# Patient Record
Sex: Male | Born: 1950 | Race: Black or African American | Hispanic: No | Marital: Married | Smoking: Never smoker
Health system: Southern US, Community
[De-identification: ages and names within clinical notes are randomized; demographics above are authoritative.]

## PROBLEM LIST (undated history)

## (undated) DIAGNOSIS — Z923 Personal history of irradiation: Secondary | ICD-10-CM

## (undated) DIAGNOSIS — K219 Gastro-esophageal reflux disease without esophagitis: Secondary | ICD-10-CM

## (undated) DIAGNOSIS — C801 Malignant (primary) neoplasm, unspecified: Secondary | ICD-10-CM

## (undated) DIAGNOSIS — R7303 Prediabetes: Secondary | ICD-10-CM

## (undated) DIAGNOSIS — I10 Essential (primary) hypertension: Secondary | ICD-10-CM

## (undated) DIAGNOSIS — Z9289 Personal history of other medical treatment: Secondary | ICD-10-CM

## (undated) DIAGNOSIS — R490 Dysphonia: Secondary | ICD-10-CM

---

## 2010-01-29 HISTORY — PX: COLONOSCOPY W/ POLYPECTOMY: SHX1380

## 2010-02-07 ENCOUNTER — Ambulatory Visit (HOSPITAL_COMMUNITY): Admission: RE | Admit: 2010-02-07 | Discharge: 2010-02-07 | Payer: Self-pay | Admitting: Gastroenterology

## 2012-01-14 ENCOUNTER — Encounter (HOSPITAL_COMMUNITY): Payer: Self-pay | Admitting: Pharmacy Technician

## 2012-01-15 ENCOUNTER — Encounter (HOSPITAL_COMMUNITY): Payer: Self-pay

## 2012-01-15 ENCOUNTER — Encounter (HOSPITAL_COMMUNITY)
Admission: RE | Admit: 2012-01-15 | Discharge: 2012-01-15 | Disposition: A | Discharge status: Home / Self Care | Payer: 59 | Source: Ambulatory Visit | Attending: Otolaryngology | Admitting: Otolaryngology

## 2012-01-15 ENCOUNTER — Other Ambulatory Visit: Payer: Self-pay | Admitting: Otolaryngology

## 2012-01-15 ENCOUNTER — Ambulatory Visit (HOSPITAL_COMMUNITY)
Admission: RE | Admit: 2012-01-15 | Discharge: 2012-01-15 | Disposition: A | Discharge status: Home / Self Care | Payer: 59 | Source: Ambulatory Visit | Attending: Otolaryngology | Admitting: Otolaryngology

## 2012-01-15 DIAGNOSIS — Z01810 Encounter for preprocedural cardiovascular examination: Secondary | ICD-10-CM | POA: Insufficient documentation

## 2012-01-15 DIAGNOSIS — Z01812 Encounter for preprocedural laboratory examination: Secondary | ICD-10-CM | POA: Insufficient documentation

## 2012-01-15 DIAGNOSIS — I10 Essential (primary) hypertension: Secondary | ICD-10-CM | POA: Insufficient documentation

## 2012-01-15 DIAGNOSIS — Z01818 Encounter for other preprocedural examination: Secondary | ICD-10-CM | POA: Insufficient documentation

## 2012-01-15 HISTORY — DX: Gastro-esophageal reflux disease without esophagitis: K21.9

## 2012-01-15 HISTORY — DX: Essential (primary) hypertension: I10

## 2012-01-15 LAB — BASIC METABOLIC PANEL
BUN: 14 mg/dL (ref 6–23)
CO2: 29 mEq/L (ref 19–32)
Calcium: 9.4 mg/dL (ref 8.4–10.5)
Chloride: 100 mEq/L (ref 96–112)
Creatinine, Ser: 0.96 mg/dL (ref 0.50–1.35)
GFR calc Af Amer: >90 mL/min (ref >90)

## 2012-01-15 LAB — SURGICAL PCR SCREEN
MRSA, PCR: NEGATIVE
Staphylococcus aureus: POSITIVE — AB

## 2012-01-15 LAB — CBC
HCT: 38.7 % — ABNORMAL LOW (ref 39.0–52.0)
MCHC: 33.9 g/dL (ref 30.0–36.0)
MCV: 83.6 fL (ref 78.0–100.0)
Platelets: 192 10*3/uL (ref 150–400)
RDW: 13.1 % (ref 11.5–15.5)
WBC: 6.4 10*3/uL (ref 4.0–10.5)

## 2012-01-15 NOTE — Pre-Procedure Instructions (Signed)
20 KAMON FAHR  01/15/2012   Your procedure is scheduled on:  Monday January 19, 2012 at 0730 AM  Report to [facility] at [phone].  Call this number if you have problems the morning of surgery: [phone]   Remember:   Do not eat food or drink:After Midnight.Sunday      Take these medicines the morning of surgery with A SIP OF WATER: Amlodipine Ismenia.Clines ] and  Pantoprazole [ Protonix ]   Do not wear jewelry,  Do not wear lotions,. You may wear deodorant.             Men may shave face and neck.  Do not bring valuables to the hospital.  Contacts, dentures or bridgework may not be worn into surgery.  Leave suitcase in the car. After surgery it may be brought to your room.  For patients admitted to the hospital, checkout time is 11:00 AM the day of discharge.   Patients discharged the day of surgery will not be allowed to drive home.    Special Instructions: Shower using CHG 2 nights before surgery and the night before surgery.  If you shower the day of surgery use CHG.  Use special wash - you have one bottle of CHG for all showers.  You should use approximately 1/3 of the bottle for each shower.   Please read over the following fact sheets that you were given: Pain Booklet, Coughing and Deep Breathing, MRSA Information and Surgical Site Infection Prevention

## 2012-01-16 ENCOUNTER — Other Ambulatory Visit (HOSPITAL_COMMUNITY): Payer: Self-pay

## 2012-01-16 NOTE — Progress Notes (Signed)
SPOKE WITH MANDY, DR [facility] NURSE RE: ORDERS NEEDING SIGNED.  MANDY WILL HAVE DR. [name] SIGN ORDERS.

## 2012-01-19 ENCOUNTER — Encounter (HOSPITAL_COMMUNITY)
Admission: RE | Disposition: A | Discharge status: Home / Self Care | Payer: Self-pay | Source: Ambulatory Visit | Attending: Otolaryngology

## 2012-01-19 ENCOUNTER — Encounter (HOSPITAL_COMMUNITY): Payer: Self-pay | Admitting: Anesthesiology

## 2012-01-19 ENCOUNTER — Ambulatory Visit (HOSPITAL_COMMUNITY): Payer: 59 | Admitting: Anesthesiology

## 2012-01-19 ENCOUNTER — Encounter (HOSPITAL_COMMUNITY): Payer: Self-pay | Admitting: *Deleted

## 2012-01-19 ENCOUNTER — Ambulatory Visit (HOSPITAL_COMMUNITY)
Admission: RE | Admit: 2012-01-19 | Discharge: 2012-01-19 | Disposition: A | Discharge status: Home / Self Care | Payer: 59 | Source: Ambulatory Visit | Attending: Otolaryngology | Admitting: Otolaryngology

## 2012-01-19 DIAGNOSIS — C320 Malignant neoplasm of glottis: Secondary | ICD-10-CM | POA: Insufficient documentation

## 2012-01-19 DIAGNOSIS — J383 Other diseases of vocal cords: Secondary | ICD-10-CM | POA: Diagnosis present

## 2012-01-19 DIAGNOSIS — C801 Malignant (primary) neoplasm, unspecified: Secondary | ICD-10-CM

## 2012-01-19 DIAGNOSIS — I10 Essential (primary) hypertension: Secondary | ICD-10-CM | POA: Insufficient documentation

## 2012-01-19 DIAGNOSIS — R498 Other voice and resonance disorders: Secondary | ICD-10-CM | POA: Insufficient documentation

## 2012-01-19 DIAGNOSIS — R490 Dysphonia: Secondary | ICD-10-CM | POA: Diagnosis present

## 2012-01-19 HISTORY — PX: MICROLARYNGOSCOPY: SHX5208

## 2012-01-19 HISTORY — PX: OTHER SURGICAL HISTORY: SHX169

## 2012-01-19 HISTORY — DX: Malignant (primary) neoplasm, unspecified: C80.1

## 2012-01-19 SURGERY — MICROLARYNGOSCOPY
Anesthesia: General | Site: Esophagus | Laterality: Left | Wound class: Contaminated

## 2012-01-19 MED ORDER — PROPOFOL 10 MG/ML IV BOLUS
INTRAVENOUS | Status: DC | PRN
  Administered 2012-01-19: 200 mg via INTRAVENOUS

## 2012-01-19 MED ORDER — METOCLOPRAMIDE HCL 5 MG/ML IJ SOLN: 10.0000 mg | Freq: Once | INTRAMUSCULAR | Status: DC | PRN

## 2012-01-19 MED ORDER — LACTATED RINGERS IV SOLN
INTRAVENOUS | Status: DC | PRN
  Administered 2012-01-19: 07:00:00 via INTRAVENOUS

## 2012-01-19 MED ORDER — OXYCODONE HCL 5 MG/5ML PO SOLN: 5.0000 mg | Freq: Once | ORAL | Status: DC | PRN

## 2012-01-19 MED ORDER — EPINEPHRINE HCL (NASAL) 0.1 % NA SOLN
NASAL | Status: AC
  Filled 2012-01-19: qty 30

## 2012-01-19 MED ORDER — ARTIFICIAL TEARS OP OINT
TOPICAL_OINTMENT | OPHTHALMIC | Status: DC | PRN
  Administered 2012-01-19: 1 via OPHTHALMIC

## 2012-01-19 MED ORDER — OXYCODONE HCL 5 MG PO TABS: 5.0000 mg | ORAL_TABLET | Freq: Once | ORAL | Status: DC | PRN

## 2012-01-19 MED ORDER — MINERAL OIL LIGHT 100 % EX OIL
TOPICAL_OIL | CUTANEOUS | Status: DC | PRN
  Administered 2012-01-19: 1 via TOPICAL

## 2012-01-19 MED ORDER — MINERAL OIL LIGHT 100 % EX OIL
TOPICAL_OIL | CUTANEOUS | Status: AC
  Filled 2012-01-19: qty 25

## 2012-01-19 MED ORDER — MIDAZOLAM HCL 5 MG/5ML IJ SOLN
INTRAMUSCULAR | Status: DC | PRN
  Administered 2012-01-19: 2 mg via INTRAVENOUS

## 2012-01-19 MED ORDER — EPINEPHRINE HCL (NASAL) 0.1 % NA SOLN
NASAL | Status: DC | PRN
  Administered 2012-01-19: 1 [drp] via NASAL

## 2012-01-19 MED ORDER — ROCURONIUM BROMIDE 100 MG/10ML IV SOLN
INTRAVENOUS | Status: DC | PRN
  Administered 2012-01-19: 35 mg via INTRAVENOUS

## 2012-01-19 MED ORDER — ONDANSETRON HCL 4 MG/2ML IJ SOLN
INTRAMUSCULAR | Status: DC | PRN
  Administered 2012-01-19: 4 mg via INTRAVENOUS

## 2012-01-19 MED ORDER — NEOSTIGMINE METHYLSULFATE 1 MG/ML IJ SOLN
INTRAMUSCULAR | Status: DC | PRN
  Administered 2012-01-19: 3 mg via INTRAVENOUS

## 2012-01-19 MED ORDER — LIDOCAINE HCL (CARDIAC) 20 MG/ML IV SOLN
INTRAVENOUS | Status: DC | PRN
  Administered 2012-01-19: 100 mg via INTRAVENOUS

## 2012-01-19 MED ORDER — 0.9 % SODIUM CHLORIDE (POUR BTL) OPTIME
TOPICAL | Status: DC | PRN
  Administered 2012-01-19: 1000 mL

## 2012-01-19 MED ORDER — HYDROMORPHONE HCL PF 1 MG/ML IJ SOLN: 0.2500 mg | INTRAMUSCULAR | Status: DC | PRN

## 2012-01-19 MED ORDER — FENTANYL CITRATE 0.05 MG/ML IJ SOLN
INTRAMUSCULAR | Status: DC | PRN
  Administered 2012-01-19 (×3): 50 ug via INTRAVENOUS

## 2012-01-19 MED ORDER — GLYCOPYRROLATE 0.2 MG/ML IJ SOLN
INTRAMUSCULAR | Status: DC | PRN
  Administered 2012-01-19: .4 mg via INTRAVENOUS

## 2012-01-19 SURGICAL SUPPLY — 26 items
CANISTER SUCTION 2500CC (MISCELLANEOUS) ×2 IMPLANT
CLOTH BEACON ORANGE TIMEOUT ST (SAFETY) ×2 IMPLANT
CONT SPEC 4OZ CLIKSEAL STRL BL (MISCELLANEOUS) ×2 IMPLANT
COVER TABLE BACK 60X90 (DRAPES) ×2 IMPLANT
DRAPE PROXIMA HALF (DRAPES) ×2 IMPLANT
DRESSING TELFA 8X3 (GAUZE/BANDAGES/DRESSINGS) ×4 IMPLANT
GLOVE BIOGEL M 7.0 STRL (GLOVE) ×2 IMPLANT
GUARD TEETH (MISCELLANEOUS) ×2 IMPLANT
HOLDER TRACH TUBE VELCRO 19.5 (MISCELLANEOUS) ×2 IMPLANT
KIT ROOM TURNOVER OR (KITS) ×2 IMPLANT
MARKER SKIN DUAL TIP RULER LAB (MISCELLANEOUS) IMPLANT
NEEDLE 18GX1X1/2 (RX/OR ONLY) (NEEDLE) ×2 IMPLANT
NS IRRIG 1000ML POUR BTL (IV SOLUTION) ×2 IMPLANT
PAD ARMBOARD 7.5X6 YLW CONV (MISCELLANEOUS) ×4 IMPLANT
PATTIES SURGICAL .5 X1 (DISPOSABLE) ×2 IMPLANT
SOLUTION ANTI FOG 6CC (MISCELLANEOUS) ×2 IMPLANT
SPONGE GAUZE 4X4 12PLY (GAUZE/BANDAGES/DRESSINGS) ×2 IMPLANT
TOWEL OR 17X24 6PK STRL BLUE (TOWEL DISPOSABLE) ×2 IMPLANT
TUBE CONNECTING 12X1/4 (SUCTIONS) ×2 IMPLANT
TUBE TRACH SHILEY  6 DIST  CUF (TUBING) IMPLANT
TUBE TRACH SHILEY 4 DIST CUF (TUBING) IMPLANT
TUBE TRACH SHILEY 6 76FEN UNCF (TUBING) IMPLANT
TUBE TRACH SHILEY 6 FEN UNCF (TUBING)
TUBE TRACH SHILEY 8 DIST CUF (TUBING) IMPLANT
TUBE TRACH SHILEY 8 FEN (TUBING) IMPLANT
WATER STERILE IRR 1000ML POUR (IV SOLUTION) ×2 IMPLANT

## 2012-01-19 NOTE — Brief Op Note (Signed)
01/19/2012  8:48 AM  PATIENT:  Jack Robinson  61 y.o. male  PRE-OPERATIVE DIAGNOSIS:  VOCAL CORD POLYP RIGHT SIDE  POST-OPERATIVE DIAGNOSIS:  VOCAL CORD POLYP RIGHT SIDE  PROCEDURE:  Procedure(s) (LRB) with comments: MICROLARYNGOSCOPY (Left) - MICROLARYNGOSCOPY WITH EXCISON OF VOCAL CORD POLYP  SURGEON:  Surgeon(s) and Role:    * Osborn Coho, MD - Primary  PHYSICIAN ASSISTANT:   ASSISTANTS: none   ANESTHESIA:   general  EBL:  25 cc  BLOOD ADMINISTERED:none  DRAINS: none   LOCAL MEDICATIONS USED:  NONE  SPECIMEN:  Source of Specimen:  Right Vocal Cord mass  DISPOSITION OF SPECIMEN:  PATHOLOGY  COUNTS:  YES  TOURNIQUET:  * No tourniquets in log *  DICTATION: .Other Dictation: Dictation Number [id number]  PLAN OF CARE: Discharge to home after PACU  PATIENT DISPOSITION:  PACU - hemodynamically stable.   Delay start of Pharmacological VTE agent (>24hrs) due to surgical blood loss or risk of bleeding: not applicable

## 2012-01-19 NOTE — Anesthesia Preprocedure Evaluation (Addendum)
Anesthesia Evaluation  Patient identified by MRN, date of birth, ID band Patient awake    Reviewed: Allergy & Precautions, H&P , NPO status , Patient's Chart, lab work & pertinent test results  History of Anesthesia Complications (+) DIFFICULT AIRWAY  Airway Mallampati: II TM Distance: >3 FB     Dental  (+) Teeth Intact   Pulmonary    Pulmonary exam normal       Cardiovascular hypertension, Pt. on medications     Neuro/Psych    GI/Hepatic GERD-  Medicated,  Endo/Other    Renal/GU      Musculoskeletal   Abdominal Normal abdominal exam  (+)   Peds  Hematology   Anesthesia Other Findings See surgeon's H&P   Reproductive/Obstetrics                          Anesthesia Physical Anesthesia Plan  ASA: II  Anesthesia Plan: General   Post-op Pain Management:    Induction: Intravenous  Airway Management Planned: Oral ETT  Additional Equipment:   Intra-op Plan:   Post-operative Plan: Extubation in OR  Informed Consent: I have reviewed the patients History and Physical, chart, labs and discussed the procedure including the risks, benefits and alternatives for the proposed anesthesia with the patient or authorized representative who has indicated his/her understanding and acceptance.   Dental advisory given  Plan Discussed with: CRNA, Anesthesiologist and Surgeon  Anesthesia Plan Comments:         Anesthesia Quick Evaluation

## 2012-01-19 NOTE — Op Note (Signed)
Jack Robinson, Jack Robinson               ACCOUNT NO.:  [id number]  MEDICAL RECORD NO.:  [id number]  LOCATION:  [facility]                         FACILITY:  [facility]  PHYSICIAN:  [name]. [name], M.D.DATE OF BIRTH:  1950-12-05  DATE OF PROCEDURE:  01/19/2012 DATE OF DISCHARGE:                              OPERATIVE REPORT   PREOPERATIVE DIAGNOSES: 1. Right vocal cord mass. 2. Chronic hoarseness.  POSTOPERATIVE DIAGNOSES: 1. Right vocal cord mass. 2. Chronic hoarseness.  INDICATION FOR SURGERY: 1. Right vocal cord mass. 2. Chronic hoarseness.  SURGICAL PROCEDURE:  Microlaryngoscopy with excision of right vocal cord mass.  ANESTHESIA:  General endotracheal.  SURGEON:  [name]. [name], MD  COMPLICATIONS:  None.  BLOOD LOSS:  Less than 25 mL.  There were no complications.  The patient transferred from the operating room to the recovery room in stable condition.  BRIEF HISTORY:  The patient is a 61 year old black male, referred to our office for evaluation of chronic hoarseness, reports a 6 month low-grade history and chronic hoarseness.  The patient is a nonsmoker.  He has a medical history of gastroesophageal reflux, hypertension.  He has been treated with reflux therapy for presumed reflux induced hoarseness.  He denied significant problems with swallowing, dysphagia or aspiration and had no respiratory concerns.  He is referred to our office for outpatient evaluation and flexible laryngoscopy in the office showed a pedunculated mass involving the right side of the larynx.  No ulceration, bleeding, or other finding with the exception of post glottic erythema consistent with active reflux.  Given his history and physical examination, I recommended microlaryngoscopy with excisional biopsy of the mass.  The risks and benefits of procedure were discussed with the patient, who understood and concurred with our plan for surgery which is scheduled on elective basis at [facility] OR.  PROCEDURE:  The patient was brought to the operating room on January 19, 2012 and placed in supine position on the operating table.  General endotracheal anesthesia was established without difficulty with the patient adequately anesthetized.  He was positioned on the operating table and prepped and draped in a sterile fashion.  Using the Dedo laryngoscope which was inserted to examine the larynx under direct laryngoscopy, the patient was found to have a large soft tissue mass along the right true vocal cord.  The mass had not been traumatized during intubation and we had excellent view of the patient's airway. The operating microscope was then moved into position for microlaryngoscopy with the patient suspended via microlaryngeal suspension.  The airway was thoroughly examined.  He had moderate amount of soft tissue swelling in the supraglottic mucosa.  There was obvious papillomatous appearing pedunculated mass which was attached along the superior aspect of the right true vocal cord.  The mass was gently grasped with cup forceps and several small biopsies were obtained. These were sent to Pathology for frozen section analysis.  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 Using cup forceps and microlaryngeal scissors, the mucosal attachment of the mass was then carefully dissected and the mass was removed and sent to Pathology for gross microscopic evaluation.  There was some small amount of continued  abnormal appearing mucosa in the anterior commissure but given concerns for possible vocal cord adhesions and scarring, this was not implemented at this time, awaiting permanent section analysis and further workup and treatment based on these findings.  Small amount of bleeding was treated with topical adrenaline soaked cottonoid pledgets which were left in place for approximately 5 minutes for hemostasis.  The pledgets were removed.  Sponge  count was correct.  The patient's larynx was reexamined and the airway was stable.  Pre and post treatment pictures were obtained for photo documentation.  The laryngoscope was then removed from micro suspension and then carefully withdrawn.  There were no loose or broken teeth.  No active bleeding. The patient was then awakened from his anesthetic.  He was extubated and transferred from the operating room to recovery room in stable condition.  There were no complications.  Blood loss was less than 25 mL.          ______________________________ [name]. [name], M.D.     DLS/MEDQ  D:  40/98/1191  T:  01/19/2012  Job:  [id number]  cc:   Epic Chart

## 2012-01-19 NOTE — Addendum Note (Signed)
Addended by: [name], Jalilah Wiltsie on: 01/19/2012 07:35 AM   Modules accepted: Orders

## 2012-01-19 NOTE — Transfer of Care (Signed)
Immediate Anesthesia Transfer of Care Note  Patient: Jack Robinson  Procedure(s) Performed: Procedure(s) (LRB) with comments: MICROLARYNGOSCOPY (Left) - MICROLARYNGOSCOPY WITH EXCISON OF VOCAL CORD POLYP  Patient Location: PACU  Anesthesia Type: General  Level of Consciousness: awake, alert  and oriented  Airway & Oxygen Therapy: Patient Spontanous Breathing and Patient connected to nasal cannula oxygen  Post-op Assessment: Report given to PACU RN, Post -op Vital signs reviewed and stable and Patient moving all extremities  Post vital signs: Reviewed and stable  Complications: No apparent anesthesia complications

## 2012-01-19 NOTE — Preoperative (Signed)
Beta Blockers   Reason not to administer Beta Blockers:Not Applicable 

## 2012-01-19 NOTE — H&P (Signed)
Jack Robinson is an 61 y.o. male.   Chief Complaint: Chronic hoarseness HPI: progressive hoarseness and 6 month hx of raspy voice  Past Medical History  Diagnosis Date  . Hypertension   . GERD (gastroesophageal reflux disease)     Past Surgical History  Procedure Date  . Colonoscopy w/ polypectomy 01/2010    every 5 years    History reviewed. No pertinent family history. Social History:  reports that he has never smoked. He has never used smokeless tobacco. He reports that he drinks alcohol. He reports that he does not use illicit drugs.  Allergies: No Known Allergies  Medications Prior to Admission  Medication Sig Dispense Refill  . amLODipine (NORVASC) 10 MG tablet Take 10 mg by mouth daily.      Marland Kitchen aspirin EC 81 MG tablet Take 81 mg by mouth daily.      Marland Kitchen atorvastatin (LIPITOR) 40 MG tablet Take 40 mg by mouth daily.      Marland Kitchen lisinopril (PRINIVIL,ZESTRIL) 20 MG tablet Take 20 mg by mouth every evening.      Marland Kitchen lisinopril-hydrochlorothiazide (PRINZIDE,ZESTORETIC) 20-25 MG per tablet Take 1 tablet by mouth every morning.       . pantoprazole (PROTONIX) 40 MG tablet Take 40 mg by mouth daily.       . potassium chloride SA (K-DUR,KLOR-CON) 20 MEQ tablet Take 20 mEq by mouth daily.        No results found for this or any previous visit (from the past 48 hour(s)). No results found.  Review of Systems  Constitutional: Negative.   HENT: Negative.   Respiratory: Negative.   Cardiovascular: Negative.   Genitourinary: Negative.   Musculoskeletal: Negative.   Neurological: Negative.     Blood pressure 158/97, pulse 89, temperature 98.5 F (36.9 C), temperature source Oral, resp. rate 18, SpO2 98.00%. Physical Exam  Constitutional: He is oriented to person, place, and time. He appears well-developed and well-nourished.  HENT:  Mouth/Throat: Uvula is midline, oropharynx is clear and moist and mucous membranes are normal.       Office laryngoscopy shows Rt VC mass c/w polyp    Neck: Normal range of motion. Neck supple.  Cardiovascular: Normal rate and regular rhythm.   Respiratory: Effort normal and breath sounds normal.  GI: Soft.  Musculoskeletal: Normal range of motion.  Neurological: He is alert and oriented to person, place, and time.     Assessment/Plan Microlaryngoscopy and excision of VC mass.   Kayven Aldaco 01/19/2012, 7:29 AM

## 2012-01-19 NOTE — Anesthesia Postprocedure Evaluation (Signed)
Anesthesia Post Note  Patient: Jack Robinson  Procedure(s) Performed: Procedure(s) (LRB): MICROLARYNGOSCOPY (Left)  Anesthesia type: General  Patient location: PACU  Post pain: Pain level controlled  Post assessment: Patient's Cardiovascular Status Stable  Last Vitals:  Filed Vitals:   01/19/12 0845  BP:   Pulse:   Temp: 36.6 C  Resp:     Post vital signs: Reviewed and stable  Level of consciousness: alert  Complications: No apparent anesthesia complications

## 2012-01-19 NOTE — Progress Notes (Signed)
Dr. Osborn Coho notified that his orders need to be signed.  He stated that he would not be able to sign them until he is at hospital so we can send the patient to the holding area and he will sign the consent form there.

## 2012-01-20 ENCOUNTER — Encounter (HOSPITAL_COMMUNITY): Payer: Self-pay | Admitting: Otolaryngology

## 2012-01-30 ENCOUNTER — Encounter: Payer: Self-pay | Admitting: Radiation Oncology

## 2012-02-02 ENCOUNTER — Ambulatory Visit
Admission: RE | Admit: 2012-02-02 | Discharge: 2012-02-02 | Disposition: A | Discharge status: Home / Self Care | Payer: 59 | Source: Ambulatory Visit | Attending: Radiation Oncology | Admitting: Radiation Oncology

## 2012-02-02 ENCOUNTER — Encounter: Payer: Self-pay | Admitting: Radiation Oncology

## 2012-02-02 VITALS — BP 157/90 | HR 99 | Temp 98.3°F | Wt 194.4 lb

## 2012-02-02 DIAGNOSIS — I10 Essential (primary) hypertension: Secondary | ICD-10-CM | POA: Insufficient documentation

## 2012-02-02 DIAGNOSIS — C320 Malignant neoplasm of glottis: Secondary | ICD-10-CM

## 2012-02-02 DIAGNOSIS — K219 Gastro-esophageal reflux disease without esophagitis: Secondary | ICD-10-CM | POA: Insufficient documentation

## 2012-02-02 DIAGNOSIS — J383 Other diseases of vocal cords: Secondary | ICD-10-CM

## 2012-02-02 HISTORY — DX: Dysphonia: R49.0

## 2012-02-02 HISTORY — DX: Malignant (primary) neoplasm, unspecified: C80.1

## 2012-02-02 MED ORDER — LARYNGOSCOPY SOLUTION RAD-ONC
15.0000 mL | Freq: Once | TOPICAL | Status: AC
  Administered 2012-02-02: 15 mL via TOPICAL
  Filled 2012-02-02: qty 15

## 2012-02-02 NOTE — Patient Instructions (Signed)
Return for planning and simulation on November 7

## 2012-02-02 NOTE — Progress Notes (Signed)
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

## 2012-02-02 NOTE — Progress Notes (Signed)
Radiation Oncology         [phone]) [phone] ________________________________  Initial outpatient Consultation  Name: Jack Robinson MRN: [id number]  Date: 02/02/2012  DOB: Jun 06, 1950  CC:No primary provider on file.  Osborn Coho, MD   REFERRING PHYSICIAN: Osborn Coho, MD  DIAGNOSIS: The primary encounter diagnosis was Vocal cord mass. A diagnosis of Malignant neoplasm of glottis was also pertinent to this visit.  HISTORY OF PRESENT ILLNESS::Jack Robinson is a 61 y.o. male who is seen out of the courtesy of Dr. [name] for an opinion concerning radiation therapy as part of management of the patient's recently diagnosed laryngeal carcinoma.  The patient presented with hoarseness. This was originally felt to be related to reflux issues however this did not respond to medication.  Patient was seen by Dr. [name] and a lesion was noted along the right vocal cord.  Patient was taken to the operating room and a large soft tissue mass was noted along the right true vocal cord. There was no vocal cord paralysis noted or extension to the contralateral vocal cord. The mass was carefully dissected and removed. Upon pathologic review the lesion was noted to be an invasive squamous cell carcinoma.   there was focal mild to moderate P 16 immunostain expression.   **.PREVIOUS RADIATION THERAPY: No  PAST MEDICAL HISTORY:  has a past medical history of Hypertension; GERD (gastroesophageal reflux disease); Cancer (01/19/12); and Chronic hoarseness.    PAST SURGICAL HISTORY: Past Surgical History  Procedure Date  . Colonoscopy w/ polypectomy 01/2010    every 5 years  . Microlaryngoscopy 01/19/2012    Procedure: MICROLARYNGOSCOPY;  Surgeon: Osborn Coho, MD;  Location: [facility];  Service: ENT;  Laterality: Left;  MICROLARYNGOSCOPY WITH EXCISON OF VOCAL CORD POLYP  . Vocal cord biopsy 01/19/12    right polyp bx=/ invasive squamous cell ca    FAMILY HISTORY: family history is not on  file.  SOCIAL HISTORY:  reports that he has never smoked. He has never used smokeless tobacco. He reports that he drinks alcohol. He reports that he does not use illicit drugs.  ALLERGIES: Review of patient's allergies indicates no known allergies.  MEDICATIONS:  Current Outpatient Prescriptions  Medication Sig Dispense Refill  . amLODipine (NORVASC) 10 MG tablet Take 10 mg by mouth daily.      Marland Kitchen aspirin EC 81 MG tablet Take 81 mg by mouth daily.      Marland Kitchen atorvastatin (LIPITOR) 40 MG tablet Take 40 mg by mouth daily. Takes 1/2 tablet at bedtime secondary to increased arm weakness.      Marland Kitchen lisinopril (PRINIVIL,ZESTRIL) 20 MG tablet Take 20 mg by mouth every evening.      Marland Kitchen lisinopril-hydrochlorothiazide (PRINZIDE,ZESTORETIC) 20-25 MG per tablet Take 1 tablet by mouth every morning.       . pantoprazole (PROTONIX) 40 MG tablet Take 40 mg by mouth daily.       . potassium chloride SA (K-DUR,KLOR-CON) 20 MEQ tablet Take 20 mEq by mouth daily.       Current Facility-Administered Medications  Medication Dose Route Frequency Provider Last Rate Last Dose  . [COMPLETED] laryngocopy solution for Rad-Onc  15 mL Topical Once Billie Lade, MD   15 mL at 02/02/12 1154    REVIEW OF SYSTEMS:  A 15 point review of systems is documented in the electronic medical record. This was obtained by the nursing staff. However, I reviewed this with the patient to discuss relevant findings and make appropriate changes. He presented with  hoarseness as above. Patient denies any swallowing problems or pain with swallowing. He  denies any otalgia.  He denies any aspiration problems or breathing problems.   PHYSICAL EXAM:  weight is 194 lb 6.4 oz (88.179 kg). His temperature is 98.3 F (36.8 C). His blood pressure is 157/90 and his pulse is 99.     General Appearance:    Alert, cooperative, no distress, appears stated age  Head:    Normocephalic, without obvious abnormality, atraumatic  Eyes:    PERRL,  conjunctiva/corneas clear, EOM's intact, fundi    benign, both eyes       Ears:    Normal TM's and external ear canals, both ears  Nose:   Nares normal, septum midline, mucosa normal, no drainage    or sinus tenderness  Throat:   Lips, mucosa, and tongue normal; teeth and gums normal  Neck:   Supple, symmetrical, trachea midline, no adenopathy;       thyroid:  No enlargement/tenderness/nodules; no carotid   bruit or JVD; a fiberoptic exam is performed to the through the left nasal cavity. A good view of the larynx was obtained. The vocal cords move well on examination. There were surgical changes noted along the anterior right vocal cord with some associated edema and possibly residual tumor.   Back:     Symmetric, no curvature, ROM normal, no CVA tenderness  Lungs:     Clear to auscultation bilaterally, respirations unlabored  Chest wall:    No tenderness or deformity  Heart:    Regular rate and rhythm, S1 and S2 normal, no murmur, rub   or gallop  Abdomen:     Soft, non-tender, bowel sounds active all four quadrants,    no masses, no organomegaly        Extremities:   Extremities normal, atraumatic, no cyanosis or edema  Pulses:   2+ and symmetric all extremities  Skin:   Skin color, texture, turgor normal, no rashes or lesions  Lymph nodes:   Cervical, supraclavicular, and axillary nodes normal  Neurologic:   CNII-XII intact. Normal strength, sensation and reflexes      throughout    LABORATORY DATA:  Lab Results  Component Value Date   WBC 6.4 01/15/2012   HGB 13.1 01/15/2012   HCT 38.7* 01/15/2012   MCV 83.6 01/15/2012   PLT 192 01/15/2012   Lab Results  Component Value Date   NA 141 01/15/2012   K 3.1* 01/15/2012   CL 100 01/15/2012   CO2 29 01/15/2012   No results found for this basename: ALT, AST, GGT, ALKPHOS, BILITOT     RADIOGRAPHY: Dg Chest 2 View  01/15/2012  *RADIOLOGY REPORT*  Clinical Data: Preoperative evaluation.  Controlled hypertension. Nonsmoker   CHEST - 2 VIEW  Comparison: None.  Findings: Low lung volumes are present.  Taking this into consideration heart size is within normal limits.  Mild prominence of the ascending aorta is seen and would correlate with the history of hypertension.  The mediastinum is otherwise unremarkable.  The lung fields are clear with no signs of focal infiltrate or congestive failure.  No pleural fluid or significant peribronchial cuffing is noted.  Bony structures appear intact.  IMPRESSION: No worrisome focal or acute cardiopulmonary abnormality seen.   Original Report Authenticated By: Bertha Stakes, M.D.       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  The patient would be a good candidate for a  definitive course of radiation therapy.  I discussed the treatment course,  side effects and potential toxicities of radiation therapy in this situation with the patient and his wife. He appears to understand and wishes to proceed with planned course of treatment.  PLAN: Simulation and planning on November 7.  I anticipate 5 and half weeks of radiation therapy directed at the anterior neck region.  I spent 60 minutes minutes face to face with the patient and more than 50% of that time was spent in counseling and/or coordination of care.   ------------------------------------------------   Billie Lade, PhD, MD

## 2012-02-02 NOTE — Addendum Note (Signed)
Encounter addended by: Tessa Lerner, RN on: 02/02/2012  3:46 PM<BR>     Documentation filed: Charges VN

## 2012-02-02 NOTE — Progress Notes (Signed)
Please see the Nurse Progress Note in the MD Initial Consult Encounter for this patient. 

## 2012-02-05 ENCOUNTER — Ambulatory Visit
Admission: RE | Admit: 2012-02-05 | Discharge: 2012-02-05 | Disposition: A | Discharge status: Home / Self Care | Payer: 59 | Source: Ambulatory Visit | Attending: Radiation Oncology | Admitting: Radiation Oncology

## 2012-02-05 DIAGNOSIS — Z510 Encounter for antineoplastic radiation therapy: Secondary | ICD-10-CM | POA: Insufficient documentation

## 2012-02-05 DIAGNOSIS — C329 Malignant neoplasm of larynx, unspecified: Secondary | ICD-10-CM | POA: Insufficient documentation

## 2012-02-05 DIAGNOSIS — C320 Malignant neoplasm of glottis: Secondary | ICD-10-CM

## 2012-02-06 NOTE — Addendum Note (Signed)
Encounter addended by: Delynn Flavin, RN on: 02/06/2012  1:57 PM<BR>     Documentation filed: Charges VN

## 2012-02-06 NOTE — Addendum Note (Signed)
Encounter addended by: Iram Lundberg Mintz Traniyah Hallett, RN on: 02/06/2012  2:03 PM<BR>     Documentation filed: Charges VN

## 2012-02-10 ENCOUNTER — Inpatient Hospital Stay: Admission: RE | Admit: 2012-02-10 | Payer: Self-pay | Source: Ambulatory Visit

## 2012-02-12 ENCOUNTER — Ambulatory Visit: Payer: 59

## 2012-02-12 ENCOUNTER — Ambulatory Visit
Admission: RE | Admit: 2012-02-12 | Discharge: 2012-02-12 | Disposition: A | Discharge status: Home / Self Care | Payer: 59 | Source: Ambulatory Visit | Attending: Radiation Oncology | Admitting: Radiation Oncology

## 2012-02-16 ENCOUNTER — Ambulatory Visit
Admission: RE | Admit: 2012-02-16 | Discharge: 2012-02-16 | Disposition: A | Discharge status: Home / Self Care | Payer: 59 | Source: Ambulatory Visit | Attending: Radiation Oncology | Admitting: Radiation Oncology

## 2012-02-17 ENCOUNTER — Ambulatory Visit
Admission: RE | Admit: 2012-02-17 | Discharge: 2012-02-17 | Disposition: A | Discharge status: Home / Self Care | Payer: 59 | Source: Ambulatory Visit | Attending: Radiation Oncology | Admitting: Radiation Oncology

## 2012-02-17 VITALS — BP 140/90 | HR 101 | Temp 98.2°F | Wt 192.7 lb

## 2012-02-17 DIAGNOSIS — C320 Malignant neoplasm of glottis: Secondary | ICD-10-CM

## 2012-02-17 NOTE — Progress Notes (Signed)
  Radiation Oncology         [phone]) [phone] ________________________________  Name: Jack Robinson MRN: [id number]  Date: 02/05/2012  DOB: September 25, 1950  SIMULATION AND TREATMENT PLANNING NOTE  DIAGNOSIS:  Laryngeal carcinoma  NARRATIVE:  The patient was brought to the CT Simulation planning suite.  Identity was confirmed.  All relevant records and images related to the planned course of therapy were reviewed.  The patient freely provided informed written consent to proceed with treatment after reviewing the details related to the planned course of therapy. The consent form was witnessed and verified by the simulation staff.  Then, the patient was set-up in a stable reproducible  supine position for radiation therapy.  CT images were obtained.  Surface markings were placed.  The CT images were loaded into the planning software.  Then the target and avoidance structures were contoured.  Treatment planning then occurred.  The radiation prescription was entered and confirmed.  A total of 1 complex treatment devices were fabricated. I have requested : Isodose Plan.  I have ordered:dose calc.  PLAN:  The patient will receive 63 Gy in 28 fractions.  ________________________________    Billie Lade, PhD, MD

## 2012-02-17 NOTE — Progress Notes (Signed)
Patient here for weekly assessment of laryngeal cancer.Reviewed routine of clinic and given Radiation Therapy and You booklet to read and will have formal patient education on Thursday or Friday of this week.Given biafine cream and discussed routine of follow up completion of radiation.Completed 2 of 28 treatments.

## 2012-02-17 NOTE — Progress Notes (Signed)
[facility]    Radiation Oncology [address] [place]     Maryln Gottron, M.D. [place], [place] [address]               Billie Lade, M.D., Ph.D. Phone: [phone]      Molli Hazard A. Kathrynn Running, M.D. Fax: [phone]      Radene Gunning, M.D., Ph.D.         Lurline Hare, M.D.         Grayland Jack, M.D Weekly Treatment Management Note  Name: Jack Robinson     MRN: [id number]        CSN: [id number] Date: 02/17/2012      DOB: 23-May-1950  CC: No primary provider on file.         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  Current Dose: 4.5 Gy  Current Fraction: 2/28  Planned Dose: 63.0 Gy  Narrative: Jack Robinson was seen today for weekly treatment management. The chart was checked and CBCT  were reviewed. He is tolerating the treatments well at this time. He denies any breathing problems or swallowing difficulties.  He continues to have some hoarseness.  Review of patient's allergies indicates no known allergies.  Current Outpatient Prescriptions  Medication Sig Dispense Refill  . amLODipine (NORVASC) 10 MG tablet Take 10 mg by mouth daily.      Marland Kitchen aspirin EC 81 MG tablet Take 81 mg by mouth daily.      Marland Kitchen atorvastatin (LIPITOR) 40 MG tablet Take 40 mg by mouth daily. Takes 1/2 tablet at bedtime secondary to increased arm weakness.      Marland Kitchen lisinopril (PRINIVIL,ZESTRIL) 20 MG tablet Take 20 mg by mouth every evening.      Marland Kitchen lisinopril-hydrochlorothiazide (PRINZIDE,ZESTORETIC) 20-25 MG per tablet Take 1 tablet by mouth every morning.       . pantoprazole (PROTONIX) 40 MG tablet Take 40 mg by mouth daily.       . potassium chloride SA (K-DUR,KLOR-CON) 20 MEQ tablet Take 20 mEq by mouth daily.       Labs:  Lab Results  Component Value Date   WBC 6.4 01/15/2012   HGB 13.1 01/15/2012   HCT 38.7* 01/15/2012   MCV 83.6 01/15/2012   PLT 192 01/15/2012   Lab Results  Component Value Date   CREATININE  0.96 01/15/2012   BUN 14 01/15/2012   NA 141 01/15/2012   K 3.1* 01/15/2012   CL 100 01/15/2012   CO2 29 01/15/2012   No results found for this basename: ALT, AST, GGT, ALK, PHOS, BILITOT    Physical Examination:  weight is 192 lb 11.2 oz (87.408 kg). His temperature is 98.2 F (36.8 C). His blood pressure is 140/90 and his pulse is 101.    Wt Readings from Last 3 Encounters:  02/17/12 192 lb 11.2 oz (87.408 kg)  02/02/12 194 lb 6.4 oz (88.179 kg)  01/15/12 196 lb 11.2 oz (89.223 kg)     Lungs - Normal respiratory effort, chest expands symmetrically. Lungs are clear to auscultation, no crackles or wheezes.  Heart has regular rhythm and rate  Abdomen is soft and non tender with normal bowel sounds The neck area shows no significant skin reaction. The oral cavity is moist without secondary infection  Assessment:  Patient tolerating treatments well  Plan: Continue treatment per original radiation prescription

## 2012-02-18 ENCOUNTER — Ambulatory Visit
Admission: RE | Admit: 2012-02-18 | Discharge: 2012-02-18 | Disposition: A | Discharge status: Home / Self Care | Payer: 59 | Source: Ambulatory Visit | Attending: Radiation Oncology | Admitting: Radiation Oncology

## 2012-02-19 ENCOUNTER — Ambulatory Visit
Admission: RE | Admit: 2012-02-19 | Discharge: 2012-02-19 | Disposition: A | Discharge status: Home / Self Care | Payer: 59 | Source: Ambulatory Visit | Attending: Radiation Oncology | Admitting: Radiation Oncology

## 2012-02-20 ENCOUNTER — Ambulatory Visit
Admission: RE | Admit: 2012-02-20 | Discharge: 2012-02-20 | Disposition: A | Discharge status: Home / Self Care | Payer: 59 | Source: Ambulatory Visit | Attending: Radiation Oncology | Admitting: Radiation Oncology

## 2012-02-21 ENCOUNTER — Ambulatory Visit
Admission: RE | Admit: 2012-02-21 | Discharge: 2012-02-21 | Disposition: A | Discharge status: Home / Self Care | Payer: 59 | Source: Ambulatory Visit | Attending: Radiation Oncology | Admitting: Radiation Oncology

## 2012-02-23 ENCOUNTER — Ambulatory Visit
Admission: RE | Admit: 2012-02-23 | Discharge: 2012-02-23 | Disposition: A | Discharge status: Home / Self Care | Payer: 59 | Source: Ambulatory Visit | Attending: Radiation Oncology | Admitting: Radiation Oncology

## 2012-02-24 ENCOUNTER — Ambulatory Visit
Admission: RE | Admit: 2012-02-24 | Discharge: 2012-02-24 | Disposition: A | Discharge status: Home / Self Care | Payer: 59 | Source: Ambulatory Visit | Attending: Radiation Oncology | Admitting: Radiation Oncology

## 2012-02-24 VITALS — BP 139/92 | HR 91 | Temp 97.2°F | Wt 194.2 lb

## 2012-02-24 DIAGNOSIS — C320 Malignant neoplasm of glottis: Secondary | ICD-10-CM

## 2012-02-24 NOTE — Progress Notes (Signed)
Here for weekly assessment of radiation to vocal cord.Completed 8 of 28 treatments.Denies pain.Skin starting to become hyperpigmented.To start application of biafine twice daily.Reviewed use of biotene and baking soda mouth rinse combination.

## 2012-02-24 NOTE — Progress Notes (Signed)
[facility]    Radiation Oncology [address] [place]     Maryln Gottron, M.D. [place], [place] [address]               Billie Lade, M.D., Ph.D. Phone: [phone]      Molli Hazard A. Kathrynn Running, M.D. Fax: [phone]      Radene Gunning, M.D., Ph.D.         Lurline Hare, M.D.         Grayland Jack, M.D Weekly Treatment Management Note  Name: Jack Robinson     MRN: [id number]        CSN: [id number] Date: 02/24/2012      DOB: 1951/01/19  CC: No primary provider on file.         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  Current Dose: 18 cGy  Current Fraction: 8/28  Planned Dose: 63.0 Gy  Narrative: Jack Robinson was seen today for weekly treatment management. The chart was checked and CBCT  were reviewed. He is tolerating his treatments well at this time. He denies any sore throat or difficulty swallowing. He denies any changes in his voice or breathing problems.  Review of patient's allergies indicates no known allergies.  Current Outpatient Prescriptions  Medication Sig Dispense Refill  . amLODipine (NORVASC) 10 MG tablet Take 10 mg by mouth daily.      Marland Kitchen aspirin EC 81 MG tablet Take 81 mg by mouth daily.      Marland Kitchen atorvastatin (LIPITOR) 40 MG tablet Take 40 mg by mouth daily. Takes 1/2 tablet at bedtime secondary to increased arm weakness.      Marland Kitchen lisinopril (PRINIVIL,ZESTRIL) 20 MG tablet Take 20 mg by mouth every evening.      Marland Kitchen lisinopril-hydrochlorothiazide (PRINZIDE,ZESTORETIC) 20-25 MG per tablet Take 1 tablet by mouth every morning.       . pantoprazole (PROTONIX) 40 MG tablet Take 40 mg by mouth daily.       . potassium chloride SA (K-DUR,KLOR-CON) 20 MEQ tablet Take 20 mEq by mouth daily.       Labs:  Lab Results  Component Value Date   WBC 6.4 01/15/2012   HGB 13.1 01/15/2012   HCT 38.7* 01/15/2012   MCV 83.6 01/15/2012   PLT 192 01/15/2012   Lab Results  Component Value Date   CREATININE 0.96 01/15/2012   BUN 14 01/15/2012   NA 141 01/15/2012   K 3.1* 01/15/2012   CL 100 01/15/2012   CO2 29 01/15/2012   No results found for this basename: ALT, AST, GGT, ALK, PHOS, BILITOT    Physical Examination:  weight is 194 lb 3.2 oz (88.089 kg). His temperature is 97.2 F (36.2 C). His blood pressure is 139/92 and his pulse is 91.    Wt Readings from Last 3 Encounters:  02/24/12 194 lb 3.2 oz (88.089 kg)  02/17/12 192 lb 11.2 oz (87.408 kg)  02/02/12 194 lb 6.4 oz (88.179 kg)    The neck area shows some mild hyperpigmentation changes and erythema. The oral cavity is free of any secondary infection. Patient has mild hoarseness at this time. Lungs - Normal respiratory effort, chest expands symmetrically. Lungs are clear to auscultation, no crackles or wheezes.  Heart has regular rhythm and rate  Abdomen is soft and non tender with normal bowel sounds  Assessment:  Patient tolerating treatments well  Plan: Continue treatment per original radiation prescription

## 2012-02-25 ENCOUNTER — Ambulatory Visit
Admission: RE | Admit: 2012-02-25 | Discharge: 2012-02-25 | Disposition: A | Discharge status: Home / Self Care | Payer: 59 | Source: Ambulatory Visit | Attending: Radiation Oncology | Admitting: Radiation Oncology

## 2012-02-27 ENCOUNTER — Ambulatory Visit: Payer: 59

## 2012-03-01 ENCOUNTER — Ambulatory Visit
Admission: RE | Admit: 2012-03-01 | Discharge: 2012-03-01 | Disposition: A | Discharge status: Home / Self Care | Payer: 59 | Source: Ambulatory Visit | Attending: Radiation Oncology | Admitting: Radiation Oncology

## 2012-03-02 ENCOUNTER — Ambulatory Visit
Admission: RE | Admit: 2012-03-02 | Discharge: 2012-03-02 | Disposition: A | Discharge status: Home / Self Care | Payer: 59 | Source: Ambulatory Visit | Attending: Radiation Oncology | Admitting: Radiation Oncology

## 2012-03-02 VITALS — BP 145/70 | HR 74 | Temp 98.1°F | Wt 192.1 lb

## 2012-03-02 DIAGNOSIS — C320 Malignant neoplasm of glottis: Secondary | ICD-10-CM

## 2012-03-02 NOTE — Progress Notes (Signed)
[name]    Radiation Oncology [address] [place]     Maryln Gottron, M.D. [place], [place] [address]               Billie Lade, M.D., Ph.D. Phone: [phone]      Molli Hazard A. Kathrynn Running, M.D. Fax: [phone]      Radene Gunning, M.D., Ph.D.         Lurline Hare, M.D.         Grayland Jack, M.D Weekly Treatment Management Note  Name: Jack Robinson     MRN: [id number]        CSN: [id number] Date: 03/02/2012      DOB: February 06, 1951  CC: No primary provider on file.         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  Current Dose: 24.75 Gy  Current Fraction: 11/28  Planned Dose: 63.0 Gy    Narrative: Jack Robinson was seen today for weekly treatment management. The chart was checked and CBCT  were reviewed. He is starting to have a mild sore throat. He denies any breathing problems or significant fatigue. He continues to work his usual schedule.  Review of patient's allergies indicates no known allergies.  Current Outpatient Prescriptions  Medication Sig Dispense Refill  . amLODipine (NORVASC) 10 MG tablet Take 10 mg by mouth daily.      Marland Kitchen aspirin EC 81 MG tablet Take 81 mg by mouth daily.      Marland Kitchen atorvastatin (LIPITOR) 40 MG tablet Take 40 mg by mouth daily. Takes 1/2 tablet at bedtime secondary to increased arm weakness.      Marland Kitchen lisinopril (PRINIVIL,ZESTRIL) 20 MG tablet Take 20 mg by mouth every evening.      Marland Kitchen lisinopril-hydrochlorothiazide (PRINZIDE,ZESTORETIC) 20-25 MG per tablet Take 1 tablet by mouth every morning.       . pantoprazole (PROTONIX) 40 MG tablet Take 40 mg by mouth daily.       . potassium chloride SA (K-DUR,KLOR-CON) 20 MEQ tablet Take 20 mEq by mouth daily.       Labs:  Lab Results  Component Value Date   WBC 6.4 01/15/2012   HGB 13.1 01/15/2012   HCT 38.7* 01/15/2012   MCV 83.6 01/15/2012   PLT 192 01/15/2012   Lab Results  Component Value Date   CREATININE  0.96 01/15/2012   BUN 14 01/15/2012   NA 141 01/15/2012   K 3.1* 01/15/2012   CL 100 01/15/2012   CO2 29 01/15/2012   No results found for this basename: ALT, AST, GGT, ALK, PHOS, BILITOT    Physical Examination:  weight is 192 lb 1.6 oz (87.136 kg). His temperature is 98.1 F (36.7 C). His blood pressure is 145/70 and his pulse is 74.    Wt Readings from Last 3 Encounters:  03/02/12 192 lb 1.6 oz (87.136 kg)  02/24/12 194 lb 3.2 oz (88.089 kg)  02/17/12 192 lb 11.2 oz (87.408 kg)    The oral cavity is free of any secondary infection. The mucosa is moist. The neck area shows hyperpigmentation changes anteriorly without any desquamation. Lungs - Normal respiratory effort, chest expands symmetrically. Lungs are clear to auscultation, no crackles or wheezes.  Heart has regular rhythm and rate  Abdomen is soft and non tender with normal bowel sounds  Assessment:  Patient tolerating treatments well except for issues as above  Plan: Continue treatment per original radiation  prescription

## 2012-03-02 NOTE — Progress Notes (Signed)
Patient here for routine weekly assessment of laryngeal cancer radiation.Completed 11 of 28 treatments.Has mild sore throat.Skin with moderate  Hyperpigmentation but no breaks in skin.denies fatigue.

## 2012-03-03 ENCOUNTER — Ambulatory Visit
Admission: RE | Admit: 2012-03-03 | Discharge: 2012-03-03 | Disposition: A | Discharge status: Home / Self Care | Payer: 59 | Source: Ambulatory Visit | Attending: Radiation Oncology | Admitting: Radiation Oncology

## 2012-03-04 ENCOUNTER — Ambulatory Visit
Admission: RE | Admit: 2012-03-04 | Discharge: 2012-03-04 | Disposition: A | Discharge status: Home / Self Care | Payer: 59 | Source: Ambulatory Visit | Attending: Radiation Oncology | Admitting: Radiation Oncology

## 2012-03-05 ENCOUNTER — Ambulatory Visit
Admission: RE | Admit: 2012-03-05 | Discharge: 2012-03-05 | Disposition: A | Discharge status: Home / Self Care | Payer: 59 | Source: Ambulatory Visit | Attending: Radiation Oncology | Admitting: Radiation Oncology

## 2012-03-08 ENCOUNTER — Ambulatory Visit
Admission: RE | Admit: 2012-03-08 | Discharge: 2012-03-08 | Disposition: A | Discharge status: Home / Self Care | Payer: 59 | Source: Ambulatory Visit | Attending: Radiation Oncology | Admitting: Radiation Oncology

## 2012-03-09 ENCOUNTER — Ambulatory Visit
Admission: RE | Admit: 2012-03-09 | Discharge: 2012-03-09 | Disposition: A | Discharge status: Home / Self Care | Payer: 59 | Source: Ambulatory Visit | Attending: Radiation Oncology | Admitting: Radiation Oncology

## 2012-03-10 ENCOUNTER — Ambulatory Visit
Admission: RE | Admit: 2012-03-10 | Discharge: 2012-03-10 | Disposition: A | Discharge status: Home / Self Care | Payer: 59 | Source: Ambulatory Visit | Attending: Radiation Oncology | Admitting: Radiation Oncology

## 2012-03-10 ENCOUNTER — Encounter: Payer: Self-pay | Admitting: Radiation Oncology

## 2012-03-10 VITALS — BP 147/93 | HR 93 | Temp 98.0°F | Resp 20 | Wt 191.8 lb

## 2012-03-10 DIAGNOSIS — C320 Malignant neoplasm of glottis: Secondary | ICD-10-CM

## 2012-03-10 MED ORDER — MAGIC MOUTHWASH W/LIDOCAINE: 5.0000 mL | Freq: Three times a day (TID) | ORAL | Status: DC | PRN

## 2012-03-10 MED ORDER — HYDROCODONE-ACETAMINOPHEN 7.5-325 MG/15ML PO SOLN: 15.0000 mL | Freq: Four times a day (QID) | ORAL | Status: DC | PRN

## 2012-03-10 NOTE — Progress Notes (Signed)
Weekly Management Note Current Dose:38.25 Gy  Projected Dose: 63 Gy   Narrative:  The patient presents for routine under treatment assessment.  CBCT/MVCT images/Port film x-rays were reviewed.  The chart was checked. More pain with swallowing and cough at night.   Physical Findings:  Dry, dark skin over anterior neck  Vitals:  Filed Vitals:   03/10/12 1401  BP: 147/93  Pulse: 93  Temp: 98 F (36.7 C)  Resp: 20   Weight:  Wt Readings from Last 3 Encounters:  03/10/12 191 lb 12.8 oz (87 kg)  03/02/12 192 lb 1.6 oz (87.136 kg)  02/24/12 194 lb 3.2 oz (88.089 kg)   Lab Results  Component Value Date   WBC 6.4 01/15/2012   HGB 13.1 01/15/2012   HCT 38.7* 01/15/2012   MCV 83.6 01/15/2012   PLT 192 01/15/2012   Lab Results  Component Value Date   CREATININE 0.96 01/15/2012   BUN 14 01/15/2012   NA 141 01/15/2012   K 3.1* 01/15/2012   CL 100 01/15/2012   CO2 29 01/15/2012     Impression:  The patient is tolerating radiation.  Plan:  Continue treatment as planned. Add hycet and mmw with lidocaine.

## 2012-03-10 NOTE — Progress Notes (Signed)
Pt c/o painful swallowing, requesting med for pain. He has only taken OTC cold medicine for "stuffy nose". He also c/o "coughing at night all the time". He has not taken any OTC meds for this, unsure of what he can take. Pt denies fatigue, loss of appetite but is eating less due to pain. He is drinking milkshakes, advised he add protein powder. He states he "talked to someone about diet".  Pt applying Biafine to neck area, no dequamation noted.

## 2012-03-11 ENCOUNTER — Ambulatory Visit
Admission: RE | Admit: 2012-03-11 | Discharge: 2012-03-11 | Disposition: A | Discharge status: Home / Self Care | Payer: 59 | Source: Ambulatory Visit | Attending: Radiation Oncology | Admitting: Radiation Oncology

## 2012-03-12 ENCOUNTER — Ambulatory Visit
Admission: RE | Admit: 2012-03-12 | Discharge: 2012-03-12 | Disposition: A | Discharge status: Home / Self Care | Payer: 59 | Source: Ambulatory Visit | Attending: Radiation Oncology | Admitting: Radiation Oncology

## 2012-03-15 ENCOUNTER — Ambulatory Visit
Admission: RE | Admit: 2012-03-15 | Discharge: 2012-03-15 | Disposition: A | Discharge status: Home / Self Care | Payer: 59 | Source: Ambulatory Visit | Attending: Radiation Oncology | Admitting: Radiation Oncology

## 2012-03-16 ENCOUNTER — Ambulatory Visit
Admission: RE | Admit: 2012-03-16 | Discharge: 2012-03-16 | Disposition: A | Discharge status: Home / Self Care | Payer: 59 | Source: Ambulatory Visit | Attending: Radiation Oncology | Admitting: Radiation Oncology

## 2012-03-16 ENCOUNTER — Encounter: Payer: Self-pay | Admitting: Radiation Oncology

## 2012-03-16 VITALS — BP 155/83 | HR 82 | Temp 97.8°F | Resp 20 | Wt 188.5 lb

## 2012-03-16 DIAGNOSIS — C320 Malignant neoplasm of glottis: Secondary | ICD-10-CM

## 2012-03-16 NOTE — Progress Notes (Signed)
[facility]    Radiation Oncology [address] [place]     Maryln Gottron, M.D. [place], [place] [address]               Billie Lade, M.D., Ph.D. Phone: [phone]      Molli Hazard A. Kathrynn Running, M.D. Fax: [phone]      Radene Gunning, M.D., Ph.D.         Lurline Hare, M.D.         Grayland Jack, M.D Weekly Treatment Management Note  Name: Jack Robinson     MRN: [id number]        CSN: [id number] Date: 03/16/2012      DOB: 1951-01-22  CC: No primary provider on file.         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  Current Dose: 47.25 Gy  Current Fraction: 21  Planned Dose: 63.0 Gy  Narrative: [name] was seen today for weekly treatment management. The chart was checked and CBCT  were reviewed. The patient did develop sore throat and difficulty swallowing last week. He was given Hycet as well as Magic mouthwash with lidocaine. These medications have been helpful for him however the Hycet  makes him very sleepy and somewhat dizzy. In light of this I recommended he try Advil or Aleve or Tylenol during the daytime.  Review of patient's allergies indicates no known allergies.  Current Outpatient Prescriptions  Medication Sig Dispense Refill  . Alum & Mag Hydroxide-Simeth (MAGIC MOUTHWASH W/LIDOCAINE) SOLN Take 5 mLs by mouth 3 (three) times daily as needed.  500 mL  0  . amLODipine (NORVASC) 10 MG tablet Take 10 mg by mouth daily.      Marland Kitchen aspirin EC 81 MG tablet Take 81 mg by mouth daily.      Marland Kitchen atorvastatin (LIPITOR) 40 MG tablet Take 40 mg by mouth daily. Takes 1/2 tablet at bedtime secondary to increased arm weakness.      . hydrocodone-acetaminophen (HYCET) 7.5-325 MG/15ML solution Take 15 mLs by mouth 4 (four) times daily as needed for pain.  473 mL  0  . lisinopril (PRINIVIL,ZESTRIL) 20 MG tablet Take 20 mg by mouth every evening.      Marland Kitchen lisinopril-hydrochlorothiazide  (PRINZIDE,ZESTORETIC) 20-25 MG per tablet Take 1 tablet by mouth every morning.       . pantoprazole (PROTONIX) 40 MG tablet Take 40 mg by mouth daily.       . potassium chloride SA (K-DUR,KLOR-CON) 20 MEQ tablet Take 20 mEq by mouth daily.       Labs:  Lab Results  Component Value Date   WBC 6.4 01/15/2012   HGB 13.1 01/15/2012   HCT 38.7* 01/15/2012   MCV 83.6 01/15/2012   PLT 192 01/15/2012   Lab Results  Component Value Date   CREATININE 0.96 01/15/2012   BUN 14 01/15/2012   NA 141 01/15/2012   K 3.1* 01/15/2012   CL 100 01/15/2012   CO2 29 01/15/2012   No results found for this basename: ALT, AST, GGT, ALK, PHOS, BILITOT    Physical Examination:  weight is 188 lb 8 oz (85.503 kg). His oral temperature is 97.8 F (36.6 C). His blood pressure is 155/83 and his pulse is 82. His respiration is 20 and oxygen saturation is 100%.    Wt Readings from Last 3 Encounters:  03/16/12 188 lb 8 oz (85.503 kg)  03/10/12 191 lb 12.8  oz (87 kg)  03/02/12 192 lb 1.6 oz (87.136 kg)    The oral cavity is moist without secondary infection. The anterior neck area shows significant hyperpigmentation changes without skin breakdown Lungs - Normal respiratory effort, chest expands symmetrically. Lungs are clear to auscultation, no crackles or wheezes.  Heart has regular rhythm and rate  Abdomen is soft and non tender with normal bowel sounds  Assessment:  Patient tolerating treatments well except for issues as above  Plan: Continue treatment per original radiation prescription

## 2012-03-16 NOTE — Progress Notes (Signed)
Patient here weekly rad txs larynx 21 completed so far, neck dry, desquamation,hyperpigmentation , , using biafine cream 2x day,patient alert,oriented x3, hoarseness, no pain until he starts to swallow, coughs non productive cough with eating and at night when trying to sleep,uses MMW prn, and hydrocodone/apap eliir 15ml prn,  100 % room air sats, eating mostly soft foods, drinks water,milk, sips water all day long, eats frequent smaler meals 2:10 PM

## 2012-03-17 ENCOUNTER — Ambulatory Visit
Admission: RE | Admit: 2012-03-17 | Discharge: 2012-03-17 | Disposition: A | Discharge status: Home / Self Care | Payer: 59 | Source: Ambulatory Visit | Attending: Radiation Oncology | Admitting: Radiation Oncology

## 2012-03-18 ENCOUNTER — Ambulatory Visit
Admission: RE | Admit: 2012-03-18 | Discharge: 2012-03-18 | Disposition: A | Discharge status: Home / Self Care | Payer: 59 | Source: Ambulatory Visit | Attending: Radiation Oncology | Admitting: Radiation Oncology

## 2012-03-19 ENCOUNTER — Ambulatory Visit
Admission: RE | Admit: 2012-03-19 | Discharge: 2012-03-19 | Disposition: A | Discharge status: Home / Self Care | Payer: 59 | Source: Ambulatory Visit | Attending: Radiation Oncology | Admitting: Radiation Oncology

## 2012-03-19 DIAGNOSIS — C320 Malignant neoplasm of glottis: Secondary | ICD-10-CM

## 2012-03-19 MED ORDER — BIAFINE EX EMUL
Freq: Two times a day (BID) | CUTANEOUS | Status: DC
  Administered 2012-03-19: 14:00:00 via TOPICAL

## 2012-03-22 ENCOUNTER — Ambulatory Visit
Admission: RE | Admit: 2012-03-22 | Discharge: 2012-03-22 | Disposition: A | Discharge status: Home / Self Care | Payer: 59 | Source: Ambulatory Visit | Attending: Radiation Oncology | Admitting: Radiation Oncology

## 2012-03-23 ENCOUNTER — Ambulatory Visit
Admission: RE | Admit: 2012-03-23 | Discharge: 2012-03-23 | Disposition: A | Discharge status: Home / Self Care | Payer: 59 | Source: Ambulatory Visit | Attending: Radiation Oncology | Admitting: Radiation Oncology

## 2012-03-23 ENCOUNTER — Encounter: Payer: Self-pay | Admitting: Radiation Oncology

## 2012-03-23 VITALS — BP 136/86 | HR 85 | Temp 98.1°F | Resp 20 | Wt 186.9 lb

## 2012-03-23 DIAGNOSIS — C320 Malignant neoplasm of glottis: Secondary | ICD-10-CM

## 2012-03-23 MED ORDER — SILVER SULFADIAZINE 1 % EX CREA
TOPICAL_CREAM | Freq: Two times a day (BID) | CUTANEOUS | Status: DC
  Administered 2012-03-23: 1 via TOPICAL

## 2012-03-23 NOTE — Addendum Note (Signed)
Encounter addended by: Delynn Flavin, RN on: 03/23/2012  3:34 PM<BR>     Documentation filed: Inpatient MAR

## 2012-03-23 NOTE — Progress Notes (Signed)
[facility]    Radiation Oncology [address] [place]     Maryln Gottron, M.D. [place], [place] [address]               Billie Lade, M.D., Ph.D. Phone: [phone]      Molli Hazard A. Kathrynn Running, M.D. Fax: [phone]      Radene Gunning, M.D., Ph.D.         Lurline Hare, M.D.         Grayland Jack, M.D Weekly Treatment Management Note  Name: Jack Robinson     MRN: [id number]        CSN: [id number] Date: 03/23/2012      DOB: Jul 26, 1950  CC: No primary provider on file.         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     Narrative: [name] was seen today for weekly treatment management. The chart was checked and CBCT  were reviewed. He continues to have a sore throat and difficulty swallowing.  He is also noticed skin sloughing along the anterior neck region.  Review of patient's allergies indicates no known allergies.  Current Outpatient Prescriptions  Medication Sig Dispense Refill  . Alum & Mag Hydroxide-Simeth (MAGIC MOUTHWASH W/LIDOCAINE) SOLN Take 5 mLs by mouth 3 (three) times daily as needed.  500 mL  0  . amLODipine (NORVASC) 10 MG tablet Take 10 mg by mouth daily.      Marland Kitchen aspirin EC 81 MG tablet Take 81 mg by mouth daily.      Marland Kitchen atorvastatin (LIPITOR) 40 MG tablet Take 40 mg by mouth daily. Takes 1/2 tablet at bedtime secondary to increased arm weakness.      Marland Kitchen emollient (BIAFINE) cream Apply topically 2 (two) times daily.      . hydrocodone-acetaminophen (HYCET) 7.5-325 MG/15ML solution Take 15 mLs by mouth 4 (four) times daily as needed for pain.  473 mL  0  . lisinopril (PRINIVIL,ZESTRIL) 20 MG tablet Take 20 mg by mouth every evening.      Marland Kitchen lisinopril-hydrochlorothiazide (PRINZIDE,ZESTORETIC) 20-25 MG per tablet Take 1 tablet by mouth every morning.       . pantoprazole (PROTONIX) 40 MG tablet Take 40 mg by mouth daily.       . potassium chloride SA (K-DUR,KLOR-CON) 20 MEQ  tablet Take 20 mEq by mouth daily.       Current Facility-Administered Medications  Medication Dose Route Frequency Provider Last Rate Last Dose  . silver sulfADIAZINE (SILVADENE) 1 % cream   Topical BID Billie Lade, MD       Labs:  Lab Results  Component Value Date   WBC 6.4 01/15/2012   HGB 13.1 01/15/2012   HCT 38.7* 01/15/2012   MCV 83.6 01/15/2012   PLT 192 01/15/2012   Lab Results  Component Value Date   CREATININE 0.96 01/15/2012   BUN 14 01/15/2012   NA 141 01/15/2012   K 3.1* 01/15/2012   CL 100 01/15/2012   CO2 29 01/15/2012   No results found for this basename: ALT,  AST,  GGT,  ALK,  PHOS,  BILITOT    Physical Examination:  weight is 186 lb 14.4 oz (84.777 kg). His oral temperature is 98.1 F (36.7 C). His blood pressure is 136/86 and his pulse is 85. His respiration is 20.    Wt Readings from Last 3 Encounters:  03/23/12 186 lb 14.4 oz (84.777 kg)  03/16/12 188 lb 8 oz (85.503 kg)  03/10/12 191 lb 12.8 oz (87 kg)    The oral cavity is free of any secondary infection. The mucosa is moist the anterior neck area shows significant desquamation and some areas of moist desquamation. There no signs of infection. Lungs - Normal respiratory effort, chest expands symmetrically. Lungs are clear to auscultation, no crackles or wheezes.  Heart has regular rhythm and rate  Abdomen is soft and non tender with normal bowel sounds  Assessment:  Patient tolerating treatments well except for issues as above  Plan: Continue treatment per original radiation prescription.  Patient was given a prescription for Silvadene today to place along his skin of the anterior neck region.

## 2012-03-23 NOTE — Progress Notes (Addendum)
Pt reports painful swallowing but denies other pain.Pt states Hycet med helpful fopr painful swallowing but only takes at night due to drowsiness.  Denies loss of appetite, eating soft foods; advised he drink nutritional supplement daily if possible to add protein to diet. Pt has slight fatigue. Applying Biafine to neck tx area for moist desquamation, some bleeding w/cleansing. Pt completes 03/26/12, gave him FU card.

## 2012-03-25 ENCOUNTER — Ambulatory Visit
Admission: RE | Admit: 2012-03-25 | Discharge: 2012-03-25 | Disposition: A | Discharge status: Home / Self Care | Payer: 59 | Source: Ambulatory Visit | Attending: Radiation Oncology | Admitting: Radiation Oncology

## 2012-03-25 DIAGNOSIS — C320 Malignant neoplasm of glottis: Secondary | ICD-10-CM

## 2012-03-25 MED ORDER — SILVER SULFADIAZINE 1 % EX CREA
TOPICAL_CREAM | Freq: Once | CUTANEOUS | Status: AC
  Administered 2012-03-25: 12:00:00 via TOPICAL

## 2012-03-25 NOTE — Progress Notes (Signed)
Patient given jar of silvadene on 03/23/2012. Patient requesting second jar as he is out of first. Provided patient with jar of silvadene. Instructed patient single jar should last longer than 48 hours. Hyperpigmentation with moist desquamation of anterior neck noted. Instructed patient to apply only a thin layer of silvadene to anterior neck bid. Patient verbalized understanding. Informed Dr. Roselind Messier of this finding.

## 2012-03-26 ENCOUNTER — Ambulatory Visit
Admission: RE | Admit: 2012-03-26 | Discharge: 2012-03-26 | Disposition: A | Discharge status: Home / Self Care | Payer: 59 | Source: Ambulatory Visit | Attending: Radiation Oncology | Admitting: Radiation Oncology

## 2012-03-29 ENCOUNTER — Ambulatory Visit: Payer: 59

## 2012-03-30 ENCOUNTER — Telehealth: Payer: Self-pay

## 2012-03-30 NOTE — Telephone Encounter (Signed)
Patient called and stated he  continues to have a cough which gets progressively worse at night.To continue with delsym or robitussin DM over next 48 hours if no relief to call back Thursday morning and I will speak with Dr.Kinard to make another suggestion.

## 2012-04-01 ENCOUNTER — Telehealth: Payer: Self-pay | Admitting: *Deleted

## 2012-04-01 NOTE — Telephone Encounter (Signed)
CALLED PATIENT TO INFORM OF FU VISIT ON 04-05-12 AT 4:40 PM, SPOKE WITH PATIENT AND HE IS AWARE OF THIS APPT.

## 2012-04-02 ENCOUNTER — Encounter: Payer: Self-pay | Admitting: Radiation Oncology

## 2012-04-04 ENCOUNTER — Encounter: Payer: Self-pay | Admitting: Radiation Oncology

## 2012-04-04 NOTE — Progress Notes (Signed)
  Radiation Oncology         [phone]) [phone] ________________________________  Name: Jack Robinson MRN: [id number]  Date: 02/12/2012  DOB: Oct 28, 1950  Simulation Verification Note  Status: outpatient  NARRATIVE: The patient was brought to the treatment unit and placed in the planned treatment position. The clinical setup was verified. Then port films were obtained and uploaded to the radiation oncology medical record software.  The treatment beams were carefully compared against the planned radiation fields. The position location and shape of the radiation fields was reviewed. They targeted volume of tissue appears to be appropriately covered by the radiation beams. Organs at risk appear to be excluded as planned.  Based on my personal review, I approved the simulation verification. The patient's treatment will proceed as planned.  -----------------------------------  Billie Lade, PhD, MD

## 2012-04-04 NOTE — Progress Notes (Signed)
  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   He did experience some fatigue as well as a sore throat and some difficulty swallowing. He in addition developed moist desquamation along the anterior neck which was treated with Silvadene  Plan: The patient has completed radiation treatment. The patient will return to radiation oncology clinic for routine followup in one month. I advised them to call or return sooner if they have any questions or concerns related to their recovery or treatment.  -----------------------------------  Billie Lade, PhD, MD

## 2012-04-05 ENCOUNTER — Ambulatory Visit: Payer: Self-pay | Admitting: Radiation Oncology

## 2012-04-05 ENCOUNTER — Encounter: Payer: Self-pay | Admitting: Radiation Oncology

## 2012-04-05 ENCOUNTER — Ambulatory Visit
Admission: RE | Admit: 2012-04-05 | Discharge: 2012-04-05 | Disposition: A | Discharge status: Home / Self Care | Payer: 59 | Source: Ambulatory Visit | Attending: Radiation Oncology | Admitting: Radiation Oncology

## 2012-04-05 VITALS — BP 142/84 | HR 78 | Temp 97.8°F | Wt 182.0 lb

## 2012-04-05 DIAGNOSIS — C320 Malignant neoplasm of glottis: Secondary | ICD-10-CM

## 2012-04-05 HISTORY — DX: Personal history of irradiation: Z92.3

## 2012-04-05 MED ORDER — HYDROCOD POLST-CHLORPHEN POLST 10-8 MG/5ML PO LQCR: 5.0000 mL | Freq: Two times a day (BID) | ORAL | Status: DC | PRN

## 2012-04-05 MED ORDER — GUAIFENESIN ER 600 MG PO TB12: 1200.0000 mg | ORAL_TABLET | Freq: Two times a day (BID) | ORAL | Status: DC

## 2012-04-05 NOTE — Progress Notes (Signed)
Patient here for concern of nagging cough that occurs throughout day but is increased overnight and increases after talking.Also has increased saliva and increased hoarsness.States aleve relieves throat discomfort and is able to eat soft foods.Encouraged him to try baked chicken without skin and add gravy if needed.Tired of just mash potatoes and gravy.

## 2012-04-05 NOTE — Progress Notes (Signed)
Radiation Oncology         [phone]) [phone] ________________________________  Name: Jack Robinson MRN: [id number]  Date: 04/05/2012  DOB: 1951-03-16  Follow-Up Visit Note  CC: No primary provider on file.  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 During the last few days of his radiation therapy he developed significant dry cough. This has become more significant since he completed his radiation therapy. This does keep him awake at night. Patient denies any chills or fever or productive cough. He denies any breathing problems.  With alot of coughing his hoarseness worsens.    He feels his throat is very dry which initiates  Coughing.                         ALLERGIES:   has no known allergies.  Meds: Current Outpatient Prescriptions  Medication Sig Dispense Refill  . Alum & Mag Hydroxide-Simeth (MAGIC MOUTHWASH W/LIDOCAINE) SOLN Take 5 mLs by mouth 3 (three) times daily as needed.  500 mL  0  . amLODipine (NORVASC) 10 MG tablet Take 10 mg by mouth daily.      Marland Kitchen aspirin EC 81 MG tablet Take 81 mg by mouth daily.      Marland Kitchen atorvastatin (LIPITOR) 40 MG tablet Take 40 mg by mouth daily. Takes 1/2 tablet at bedtime secondary to increased arm weakness.      Marland Kitchen emollient (BIAFINE) cream Apply topically 2 (two) times daily.      . hydrocodone-acetaminophen (HYCET) 7.5-325 MG/15ML solution Take 15 mLs by mouth 4 (four) times daily as needed for pain.  473 mL  0  . lisinopril (PRINIVIL,ZESTRIL) 20 MG tablet Take 20 mg by mouth every evening.      Marland Kitchen lisinopril-hydrochlorothiazide (PRINZIDE,ZESTORETIC) 20-25 MG per tablet Take 1 tablet by mouth every morning.       . pantoprazole (PROTONIX) 40 MG tablet Take 40 mg by mouth daily.       . potassium chloride SA (K-DUR,KLOR-CON) 20 MEQ tablet Take 20 mEq by mouth daily.      .  chlorpheniramine-HYDROcodone (TUSSIONEX) 10-8 MG/5ML LQCR Take 5 mLs by mouth every 12 (twelve) hours as needed.  140 mL  0  . guaiFENesin (MUCINEX) 600 MG 12 hr tablet Take 2 tablets (1,200 mg total) by mouth 2 (two) times daily.  30 tablet  1    Physical Findings: The patient is in no acute distress. Patient is alert and oriented.  weight is 182 lb (82.555 kg). His temperature is 97.8 F (36.6 C). His blood pressure is 142/84 and his pulse is 78. .   The lungs are clear. The heart has regular rhythm and rate. There is no wheezing detected. Examination of the neck area reveals no further moist desquamation. Patient has areas of hyperpigmentation changes and hypopigmentation along the anterior neck region.   the oral cavity reveals no secondary infection. The mucosa is moist.     Radiographic Findings: No results found.  Impression:  The patient is recovering from the effects of radiation but has significant problems with a dry irritating cough. This seems to be initiated in the throat region. He will be placed on Tussionex and Mucinex.  Plan:  The patient will keep his already scheduled followup appointment later this month.  _____________________________________  Billie Lade, PhD, MD

## 2012-04-19 ENCOUNTER — Ambulatory Visit
Admission: RE | Admit: 2012-04-19 | Discharge: 2012-04-19 | Disposition: A | Discharge status: Home / Self Care | Payer: 59 | Source: Ambulatory Visit | Attending: Radiation Oncology | Admitting: Radiation Oncology

## 2012-04-19 ENCOUNTER — Encounter: Payer: Self-pay | Admitting: Radiation Oncology

## 2012-04-19 VITALS — BP 149/90 | HR 81 | Temp 97.0°F | Resp 16 | Wt 178.4 lb

## 2012-04-19 DIAGNOSIS — C320 Malignant neoplasm of glottis: Secondary | ICD-10-CM

## 2012-04-19 NOTE — Progress Notes (Signed)
  Radiation Oncology         [phone]) [phone] ________________________________  Name: Jack Robinson MRN: [id number]  Date: 04/19/2012  DOB: 07-08-1950  Follow-Up Visit Note  CC: No primary provider on file.  Jack Coho, MD  Diagnosis:   Glottic carcinoma  Interval Since Last Radiation:  4 weeks  Narrative:  The patient returns today for routine follow-up.  He continues to improve. He still has some discomfort with swallowing but this is controlled well with Aleve. Patient denies any breathing problems He continues to have some hoarseness but his voice overall is stronger. He uses cough medicine once a day.  He is consuming soft foods at this time.                            ALLERGIES:   has no known allergies.  Meds: Current Outpatient Prescriptions  Medication Sig Dispense Refill  . amLODipine (NORVASC) 10 MG tablet Take 10 mg by mouth daily.      Marland Kitchen aspirin EC 81 MG tablet Take 81 mg by mouth daily.      Marland Kitchen atorvastatin (LIPITOR) 40 MG tablet Take 40 mg by mouth daily. Takes 1/2 tablet at bedtime secondary to increased arm weakness.      . chlorpheniramine-HYDROcodone (TUSSIONEX) 10-8 MG/5ML LQCR Take 5 mLs by mouth every 12 (twelve) hours as needed.  140 mL  0  . emollient (BIAFINE) cream Apply topically 2 (two) times daily.      Marland Kitchen lisinopril (PRINIVIL,ZESTRIL) 20 MG tablet Take 20 mg by mouth every evening.      Marland Kitchen lisinopril-hydrochlorothiazide (PRINZIDE,ZESTORETIC) 20-25 MG per tablet Take 1 tablet by mouth every morning.       . potassium chloride SA (K-DUR,KLOR-CON) 20 MEQ tablet Take 20 mEq by mouth daily.      . Alum & Mag Hydroxide-Simeth (MAGIC MOUTHWASH W/LIDOCAINE) SOLN Take 5 mLs by mouth 3 (three) times daily as needed.  500 mL  0  . guaiFENesin (MUCINEX) 600 MG 12 hr tablet Take 2 tablets (1,200 mg total) by mouth 2 (two) times daily.  30 tablet  1  . hydrocodone-acetaminophen (HYCET) 7.5-325 MG/15ML solution Take 15 mLs by mouth 4 (four) times daily as needed for pain.   473 mL  0  . pantoprazole (PROTONIX) 40 MG tablet Take 40 mg by mouth daily.         Physical Findings: The patient is in no acute distress. Patient is alert and oriented.  weight is 178 lb 6.4 oz (80.922 kg). His oral temperature is 97 F (36.1 C). His blood pressure is 149/90 and his pulse is 81. His respiration is 16. .  The patient's skin is well healed at this time. He does have hyperpigmentation changes in the anterior neck region. The oral cavity is moist without secondary infection. the lungs are clear. The heart has a regular rhythm and rate.  Lab Findings: Lab Results  Component Value Date   WBC 6.4 01/15/2012   HGB 13.1 01/15/2012   HCT 38.7* 01/15/2012   MCV 83.6 01/15/2012   PLT 192 01/15/2012    @LASTCHEM @  Radiographic Findings: No results found.  Impression:  The patient is recovering from the effects of radiation.  I have asked him to schedule a followup appointment with Dr. Annalee Robinson approximately 4-6 weeks.  Plan:  Routine followup in radiation oncology in 3 months.  _____________________________________  -----------------------------------  Jack Lade, PhD, MD

## 2012-04-19 NOTE — Progress Notes (Signed)
Patient presents to the clinic today accompanied by his wife for a follow up appointment with Dr. Roselind Messier. Patient alert and oriented to person, place, and time. No distress noted. Steady gait noted. Pleasant affect noted. Patient denies pain at this time. Hoarseness noted. Patient reports dry cough has improved. Patient continues to use tussinex. Patient reports using biafine bid on anterior neck. Hyperpigmentation without desquamation of anterior neck noted. Patient states, "dry mouth doesn't affect me much because I always use the biotene, have candy in my mouth or sip water." patient denies taste changes. Patient reports taking aleve daily to prevent pain associated with swallowing. Patient reports eating only soft foods. Patient's wife reports that the patient only eats one meal a day and refuses to drink ensure or boost. Weight loss from 182 on 1/6 noted down to 178. Encouraged small frequent meals. Patient's wife reports that he often chocks after eating. Reported all findings to Dr. Roselind Messier.

## 2012-07-19 ENCOUNTER — Ambulatory Visit
Admission: RE | Admit: 2012-07-19 | Discharge: 2012-07-19 | Disposition: A | Discharge status: Home / Self Care | Payer: 59 | Source: Ambulatory Visit | Attending: Radiation Oncology | Admitting: Radiation Oncology

## 2012-07-19 ENCOUNTER — Encounter: Payer: Self-pay | Admitting: Radiation Oncology

## 2012-07-19 VITALS — BP 155/90 | HR 87 | Temp 97.6°F | Resp 20 | Wt 178.8 lb

## 2012-07-19 DIAGNOSIS — C320 Malignant neoplasm of glottis: Secondary | ICD-10-CM | POA: Insufficient documentation

## 2012-07-19 MED ORDER — LARYNGOSCOPY SOLUTION RAD-ONC
15.0000 mL | Freq: Once | TOPICAL | Status: AC
  Administered 2012-07-19: 15 mL via TOPICAL
  Filled 2012-07-19: qty 15

## 2012-07-19 NOTE — Progress Notes (Signed)
  Radiation Oncology         [phone]) [phone] ________________________________  Name: Jack Robinson MRN: [id number]  Date: 07/19/2012  DOB: [birth year]  Follow-Up Visit Note  CC: No primary provider on file.  Osborn Coho, MD  Diagnosis:   Glottic carcinoma  Interval Since Last Radiation:  4  months  Narrative:  The patient returns today for routine follow-up.  He is doing much better this time. He denies any voice problems or difficulty with swallowing. He denies any aspiration problems.  He continues to work full-time.  His energy level is still not 100% however.  He did see Dr. [name] in late February with a good report and exam.                              ALLERGIES:  has No Known Allergies.  Meds: Current Outpatient Prescriptions  Medication Sig Dispense Refill  . amLODipine (NORVASC) 10 MG tablet Take 10 mg by mouth daily.      Marland Kitchen aspirin EC 81 MG tablet Take 81 mg by mouth daily.      Marland Kitchen atorvastatin (LIPITOR) 40 MG tablet Take 40 mg by mouth daily. Takes 1/2 tablet at bedtime secondary to increased arm weakness.      Marland Kitchen lisinopril (PRINIVIL,ZESTRIL) 20 MG tablet Take 20 mg by mouth every evening.      Marland Kitchen lisinopril-hydrochlorothiazide (PRINZIDE,ZESTORETIC) 20-25 MG per tablet Take 1 tablet by mouth every morning.       . potassium chloride SA (K-DUR,KLOR-CON) 20 MEQ tablet Take 20 mEq by mouth daily.       No current facility-administered medications for this encounter.    Physical Findings: The patient is in no acute distress. Patient is alert and oriented.  weight is 178 lb 12.8 oz (81.103 kg). His oral temperature is 97.6 F (36.4 C). His blood pressure is 155/90 and his pulse is 87. His respiration is 20. . The lungs are clear. The heart has a regular rhythm and rate. The neck and supraclavicular regions are free of adenopathy. The patient does have significant hyperpigmentation changes along the anterior neck region. The oral cavity is moist without secondary infection.  The patient proceeded to have a topical anesthetic and decongestant placed along the left nasal cavity. He then proceeded to undergo fiberoptic examination. The vocal cords moved well on examination. There were no mucosal lesions noted. There is minimal supraglottic edema.     Impression:  The patient is recovering from the effects of radiation.  No evidence of recurrence on clinical exam today  Plan:  Routine followup in 3 months. In the interim the patient will see Dr. [name].  _____________________________________  -----------------------------------  Billie Lade, PhD, MD

## 2012-07-19 NOTE — Progress Notes (Signed)
Pt denies pain, difficulty eating, swallowing, dry mouth, loss of appetite. He is eating some solid foods. He states his energy level has not returned to normal. He states his voice occasionally "isn't right", but he states it is not really hoarse. Pt has appt w/Dr [name] in May 2014.

## 2012-07-28 NOTE — Addendum Note (Signed)
Encounter addended by: Delynn Flavin, RN on: 07/28/2012  6:29 PM<BR>     Documentation filed: Charges VN

## 2012-08-02 ENCOUNTER — Telehealth: Payer: Self-pay | Admitting: Dietician

## 2012-10-18 ENCOUNTER — Encounter: Payer: Self-pay | Admitting: Radiation Oncology

## 2012-10-18 ENCOUNTER — Ambulatory Visit
Admission: RE | Admit: 2012-10-18 | Discharge: 2012-10-18 | Disposition: A | Discharge status: Home / Self Care | Payer: 59 | Source: Ambulatory Visit | Attending: Radiation Oncology | Admitting: Radiation Oncology

## 2012-10-18 VITALS — BP 144/81 | HR 77 | Temp 97.6°F | Ht 71.0 in | Wt 182.8 lb

## 2012-10-18 DIAGNOSIS — Z923 Personal history of irradiation: Secondary | ICD-10-CM | POA: Insufficient documentation

## 2012-10-18 DIAGNOSIS — C320 Malignant neoplasm of glottis: Secondary | ICD-10-CM | POA: Insufficient documentation

## 2012-10-18 MED ORDER — LARYNGOSCOPY SOLUTION RAD-ONC
15.0000 mL | Freq: Once | TOPICAL | Status: AC
  Administered 2012-10-18: 15 mL via TOPICAL
  Filled 2012-10-18: qty 15

## 2012-10-18 NOTE — Progress Notes (Signed)
Al[facility] here for follow up after treatment to his glottis.  He denies pain and fatigue.  He does not have any trouble swallowing and is eating solid foods.  He denies thick saliva.  He denies hearing loss.   He does not have jaw stiffness.  He does have hyperpigmentation on his neck.  He is wondering what the best lotion would be to use.

## 2012-10-18 NOTE — Progress Notes (Signed)
  Radiation Oncology         [phone]) [phone] ________________________________  Name: Jack Robinson MRN: [id number]  Date: 10/18/2012  DOB: Sep 07, 1950  Follow-Up Visit Note  CC: No primary provider on file.  Osborn Coho, MD  Diagnosis:   Glottic carcinoma  Interval Since Last Radiation:  7  months  Narrative:  The patient returns today for routine follow-up.  He is doing well at this time. His energy level as close to 100%. He denies any breathing problems or cough. He denies any  hemoptysis.  His voice continues to improve.                              ALLERGIES:  has No Known Allergies.  Meds: Current Outpatient Prescriptions  Medication Sig Dispense Refill  . amLODipine (NORVASC) 10 MG tablet Take 10 mg by mouth daily.      Marland Kitchen aspirin EC 81 MG tablet Take 81 mg by mouth daily.      Marland Kitchen atorvastatin (LIPITOR) 40 MG tablet Take 40 mg by mouth daily. Takes 1/2 tablet at bedtime secondary to increased arm weakness.      Marland Kitchen lisinopril (PRINIVIL,ZESTRIL) 20 MG tablet Take 20 mg by mouth every evening.      Marland Kitchen lisinopril-hydrochlorothiazide (PRINZIDE,ZESTORETIC) 20-25 MG per tablet Take 1 tablet by mouth every morning.       . potassium chloride SA (K-DUR,KLOR-CON) 20 MEQ tablet Take 20 mEq by mouth daily.       No current facility-administered medications for this encounter.    Physical Findings: The patient is in no acute distress. Patient is alert and oriented.  height is 5\' 11"  (1.803 m) and weight is 182 lb 12.8 oz (82.918 kg). His temperature is 97.6 F (36.4 C). His blood pressure is 144/81 and his pulse is 77. His oxygen saturation is 100%. .  The lungs are clear. The heart has a regular rhythm and rate. Examination of the neck and supraclavicular region reveals no evidence of adenopathy. The oral cavity is free of secondary infection or mucosal lesion. The patient proceeded to have a topical anesthetic and decongestant placed along the right nasal cavity. He then proceeded to  undergo a fiberoptic examination through the right nasal cavity. I was able to obtain a good view of the larynx area. there were some mild supraglottic edema but no mucosal lesions. The vocal cords move well on examination. There are no lesions noted along the vocal cords.   Lab Findings: Lab Results  Component Value Date   WBC 6.4 01/15/2012   HGB 13.1 01/15/2012   HCT 38.7* 01/15/2012   MCV 83.6 01/15/2012   PLT 192 01/15/2012      Radiographic Findings: No results found.  Impression:  The patient is recovering from the effects of radiation.  No evidence of recurrence on clinical exam today.  Plan:  When necessary followup in radiation oncology. Patient is seen by otolaryngology every 3 months and therefore I will defer followup.  _____________________________________  -----------------------------------  Billie Lade, PhD, MD

## 2013-05-04 ENCOUNTER — Telehealth: Payer: Self-pay | Admitting: Oncology

## 2013-05-04 NOTE — Telephone Encounter (Signed)
Jack Robinson called and asked if he still needed to be using cocoa butter with Vitamin E lotion on his face/neck.  He said he finished his treatment a year ago and still has hyperpigmentation.  Advised him that he may always have darker skin in the treatment area and to keep using the lotion as his skin may be dryer than before.   Jack Robinson verbalized agreement.

## 2014-02-10 ENCOUNTER — Encounter (HOSPITAL_BASED_OUTPATIENT_CLINIC_OR_DEPARTMENT_OTHER): Payer: Self-pay | Admitting: *Deleted

## 2014-02-10 ENCOUNTER — Emergency Department (HOSPITAL_BASED_OUTPATIENT_CLINIC_OR_DEPARTMENT_OTHER): Payer: 59

## 2014-02-10 ENCOUNTER — Observation Stay (HOSPITAL_BASED_OUTPATIENT_CLINIC_OR_DEPARTMENT_OTHER)
Admission: EM | Admit: 2014-02-10 | Discharge: 2014-02-11 | Disposition: A | Discharge status: Home / Self Care | Payer: 59 | Attending: Cardiology | Admitting: Cardiology

## 2014-02-10 DIAGNOSIS — N390 Urinary tract infection, site not specified: Secondary | ICD-10-CM | POA: Diagnosis not present

## 2014-02-10 DIAGNOSIS — D649 Anemia, unspecified: Secondary | ICD-10-CM | POA: Insufficient documentation

## 2014-02-10 DIAGNOSIS — Z8521 Personal history of malignant neoplasm of larynx: Secondary | ICD-10-CM | POA: Insufficient documentation

## 2014-02-10 DIAGNOSIS — K219 Gastro-esophageal reflux disease without esophagitis: Secondary | ICD-10-CM | POA: Diagnosis not present

## 2014-02-10 DIAGNOSIS — R778 Other specified abnormalities of plasma proteins: Secondary | ICD-10-CM | POA: Diagnosis present

## 2014-02-10 DIAGNOSIS — Z923 Personal history of irradiation: Secondary | ICD-10-CM | POA: Diagnosis not present

## 2014-02-10 DIAGNOSIS — Z79899 Other long term (current) drug therapy: Secondary | ICD-10-CM | POA: Insufficient documentation

## 2014-02-10 DIAGNOSIS — E785 Hyperlipidemia, unspecified: Secondary | ICD-10-CM | POA: Diagnosis not present

## 2014-02-10 DIAGNOSIS — E876 Hypokalemia: Secondary | ICD-10-CM | POA: Diagnosis not present

## 2014-02-10 DIAGNOSIS — I10 Essential (primary) hypertension: Secondary | ICD-10-CM | POA: Insufficient documentation

## 2014-02-10 DIAGNOSIS — R7989 Other specified abnormal findings of blood chemistry: Secondary | ICD-10-CM

## 2014-02-10 DIAGNOSIS — Z7982 Long term (current) use of aspirin: Secondary | ICD-10-CM | POA: Diagnosis not present

## 2014-02-10 DIAGNOSIS — R55 Syncope and collapse: Principal | ICD-10-CM | POA: Insufficient documentation

## 2014-02-10 HISTORY — DX: Personal history of other medical treatment: Z92.89

## 2014-02-10 LAB — URINALYSIS, ROUTINE W REFLEX MICROSCOPIC
Bilirubin Urine: NEGATIVE
GLUCOSE, UA: NEGATIVE mg/dL
KETONES UR: NEGATIVE mg/dL
NITRITE: NEGATIVE
Specific Gravity, Urine: 1.018 (ref 1.005–1.030)
Urobilinogen, UA: 0.2 mg/dL (ref 0.0–1.0)
pH: 5.5 (ref 5.0–8.0)

## 2014-02-10 LAB — CBC WITH DIFFERENTIAL/PLATELET
BASOS PCT: 0 % (ref 0–1)
Basophils Absolute: 0 10*3/uL (ref 0.0–0.1)
EOS ABS: 0 10*3/uL (ref 0.0–0.7)
EOS PCT: 0 % (ref 0–5)
HCT: 37.8 % — ABNORMAL LOW (ref 39.0–52.0)
HEMOGLOBIN: 12.5 g/dL — AB (ref 13.0–17.0)
LYMPHS PCT: 6 % — AB (ref 12–46)
Lymphs Abs: 0.9 10*3/uL (ref 0.7–4.0)
MCH: 28.2 pg (ref 26.0–34.0)
MCHC: 33.1 g/dL (ref 30.0–36.0)
MCV: 85.3 fL (ref 78.0–100.0)
MONO ABS: 0.9 10*3/uL (ref 0.1–1.0)
Monocytes Relative: 6 % (ref 3–12)
Neutro Abs: 12.6 10*3/uL — ABNORMAL HIGH (ref 1.7–7.7)
Neutrophils Relative %: 88 % — ABNORMAL HIGH (ref 43–77)
PLATELETS: 178 10*3/uL (ref 150–400)
RBC: 4.43 MIL/uL (ref 4.22–5.81)
RDW: 13.3 % (ref 11.5–15.5)
WBC: 14.4 10*3/uL — ABNORMAL HIGH (ref 4.0–10.5)

## 2014-02-10 LAB — URINE MICROSCOPIC-ADD ON

## 2014-02-10 LAB — BASIC METABOLIC PANEL
Anion gap: 16 — ABNORMAL HIGH (ref 5–15)
BUN: 17 mg/dL (ref 6–23)
CHLORIDE: 99 meq/L (ref 96–112)
CO2: 23 mEq/L (ref 19–32)
Calcium: 9.4 mg/dL (ref 8.4–10.5)
Creatinine, Ser: 1.1 mg/dL (ref 0.50–1.35)
GFR, EST AFRICAN AMERICAN: 80 mL/min — AB (ref >90)
GFR, EST NON AFRICAN AMERICAN: 69 mL/min — AB (ref >90)
GLUCOSE: 198 mg/dL — AB (ref 70–99)
Potassium: 3.2 mEq/L — ABNORMAL LOW (ref 3.7–5.3)
SODIUM: 138 meq/L (ref 137–147)

## 2014-02-10 LAB — TROPONIN I: TROPONIN I: 0.38 ng/mL — AB (ref <0.30)

## 2014-02-10 MED ORDER — ASPIRIN 81 MG PO CHEW
324.0000 mg | CHEWABLE_TABLET | Freq: Once | ORAL | Status: AC
  Administered 2014-02-10: 324 mg via ORAL
  Filled 2014-02-10: qty 4

## 2014-02-10 MED ORDER — HEPARIN (PORCINE) IN NACL 100-0.45 UNIT/ML-% IJ SOLN
1150.0000 [IU]/h | INTRAMUSCULAR | Status: DC
  Administered 2014-02-10: 1150 [IU]/h via INTRAVENOUS
  Filled 2014-02-10 (×3): qty 250

## 2014-02-10 MED ORDER — HEPARIN BOLUS VIA INFUSION
4000.0000 [IU] | Freq: Once | INTRAVENOUS | Status: AC
  Administered 2014-02-10: 4000 [IU] via INTRAVENOUS

## 2014-02-10 NOTE — ED Notes (Signed)
Critical Troponin 0.38 Dr. Doy Mince Notified.

## 2014-02-10 NOTE — ED Notes (Signed)
Call to [facility], await call back

## 2014-02-10 NOTE — ED Provider Notes (Signed)
CSN: [id number]     Arrival date & time 02/10/14  2004 History  This chart was scribed for Artis Delay, MD by Starleen Arms, ED Scribe. This patient was seen in room MH03/MH03 and the patient's care was started at 9:26 PM.   Chief Complaint  Patient presents with  . Near Syncope   Patient is a 63 y.o. male presenting with near-syncope. The history is provided by the patient. No language interpreter was used.  Near Syncope This is a new problem. The current episode started 1 to 2 hours ago. The problem occurs constantly. The problem has been resolved. Pertinent negatives include no chest pain, no headaches and no shortness of breath. Nothing aggravates the symptoms. The symptoms are relieved by eating, rest and drinking. He has tried nothing for the symptoms.   HPI Comments: Jack Robinson is a 63 y.o. male with a history of HTN, HLD.  who presents to the Emergency Department complaining of near syncope this evening while at a health clinic.  Patient reports he was at the clinic to be seen for symptoms of a UTI.  While sitting at the clinic, he "felt faint" and became diaphoretic after which time the staff took his vitals, gave him Kool-aid, and then checked his blood sugar.  He was told to report to ED for follow-up.  Patient states he only ate breakfast today.  Patient denies history of diabetes.  Patient denies family history of MI, CHF.  Patient denies being a smoker.    Patient also complains of malodorous urine and increased urinary frequency onset today.  He reports a UTI 2-3 weeks ago during which time he experienced these same symptoms.  He was prescribed Ciprofloxacin, which he completed today without food.  Patient denies dysuria, abdominal pain, fever, penile discharge, painful defecation.   PCP: Dr. Lavone Orn  Cardiologist: none Past Medical History  Diagnosis Date  . Hypertension   . GERD (gastroesophageal reflux disease)   . 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 Chronic hoarseness     4 to 5 months  . History of radiation therapy 02/16/12-03/26/12    vocal cord /glottis ca   Past Surgical History  Procedure Laterality Date  . Colonoscopy w/ polypectomy  01/2010    every 5 years  . Microlaryngoscopy  01/19/2012    Procedure: MICROLARYNGOSCOPY;  Surgeon: Jerrell Belfast, MD;  Location: [facility];  Service: ENT;  Laterality: Left;  MICROLARYNGOSCOPY WITH EXCISON OF VOCAL CORD POLYP  . Vocal cord biopsy  01/19/12    right polyp bx=/ invasive squamous cell ca   History reviewed. No pertinent family history. History  Substance Use Topics  . Smoking status: Never Smoker   . Smokeless tobacco: Never Used  . Alcohol Use: Yes     Comment: social    Review of Systems  Constitutional: Positive for diaphoresis.  Respiratory: Negative for shortness of breath.   Cardiovascular: Positive for near-syncope. Negative for chest pain.  Genitourinary: Positive for frequency.  Neurological: Positive for light-headedness. Negative for syncope and headaches.  All other systems reviewed and are negative.     Allergies  Review of patient's allergies indicates no known allergies.  Home Medications   Prior to Admission medications   Medication Sig Start Date End Date Taking? Authorizing Provider  amLODipine (NORVASC) 10 MG tablet Take 10 mg by mouth daily.    Historical Provider, MD  aspirin EC 81 MG tablet Take 81 mg by mouth daily.  Historical Provider, MD  atorvastatin (LIPITOR) 40 MG tablet Take 40 mg by mouth daily. Takes 1/2 tablet at bedtime secondary to increased arm weakness.    Historical Provider, MD  lisinopril (PRINIVIL,ZESTRIL) 20 MG tablet Take 20 mg by mouth every evening.    Historical Provider, MD  lisinopril-hydrochlorothiazide (PRINZIDE,ZESTORETIC) 20-25 MG per tablet Take 1 tablet by mouth every morning.     Historical Provider, MD  potassium chloride SA (K-DUR,KLOR-CON) 20 MEQ tablet Take 20 mEq by mouth daily.     Historical Provider, MD   BP 140/99 mmHg  Pulse 108  Temp(Src) 98.2 F (36.8 C)  Resp 16  Ht 5\' 11"  (1.803 m)  Wt 190 lb (86.183 kg)  BMI 26.51 kg/m2  SpO2 97% Physical Exam  Constitutional: He is oriented to person, place, and time. He appears well-developed and well-nourished. No distress.  HENT:  Head: Normocephalic and atraumatic.  Mouth/Throat: Oropharynx is clear and moist.  Eyes: Conjunctivae are normal. Pupils are equal, round, and reactive to light. No scleral icterus.  Neck: Neck supple.  Cardiovascular: Normal rate, regular rhythm, normal heart sounds and intact distal pulses.   No murmur heard. Pulmonary/Chest: Effort normal and breath sounds normal. No stridor. No respiratory distress. He has no wheezes. He has no rales.  Abdominal: Soft. He exhibits no distension. There is no tenderness.  Musculoskeletal: Normal range of motion. He exhibits no edema.  Neurological: He is alert and oriented to person, place, and time.  Skin: Skin is warm and dry. No rash noted.  Psychiatric: He has a normal mood and affect. His behavior is normal.  Nursing note and vitals reviewed.   ED Course  Procedures (including critical care time)  DIAGNOSTIC STUDIES: Oxygen Saturation is 97% on RA, normal by my interpretation.    COORDINATION OF CARE:  9:34 PM Discussed patient's elevated troponin levels and potential need for further evaluation. Will consult with cardiologist.  Patient will be transferred to [facility] for further evaluation pending cardiologist recommendation.     Labs Review All labs drawn in ED reviewed.   Imaging Review No results found.   EKG Interpretation   Date/Time:  Friday February 10 2014 20:22:44 EST Ventricular Rate:  98 PR Interval:  186 QRS Duration: 90 QT Interval:  350 QTC Calculation: 446 R Axis:   -15 Text Interpretation:  Normal sinus rhythm Normal ECG No significant change  was found Confirmed by [facility]  MD, TREY ([id number]) on 02/10/2014  9:31:51 PM      MDM   Final diagnoses:  Near syncope    63 yo male presenting to the emergency department after a near syncope episode at urgent care. Asymptomatic at time of my evaluation.  Symptoms sounded most consistent with near-syncope secondary to poor by mouth intake. However, his lab tests indicated an elevated troponin. Therefore, he was transferred to [facility] for admission for further workup.  I personally performed the services described in this documentation, which was scribed in my presence. The recorded information has been reviewed and is accurate.     Artis Delay, MD 02/14/14 [phone]

## 2014-02-10 NOTE — ED Notes (Signed)
Pt being seen at [facility] , reports near syncopal episode during exam, sent here for eval

## 2014-02-10 NOTE — Progress Notes (Signed)
ANTICOAGULATION CONSULT NOTE - Initial Consult  Pharmacy Consult for Heparin  Indication: chest pain/ACS  No Known Allergies  Patient Measurements: Height: 5\' 11"  (180.3 cm) Weight: 190 lb (86.183 kg) IBW/kg (Calculated) : 75.3 Heparin Dosing Weight: 86 kg   Vital Signs: Temp: 98.2 F (36.8 C) (11/13 2011) BP: 134/77 mmHg (11/13 2140) Pulse Rate: 94 (11/13 2140)  Labs:  Recent Labs  02/10/14 2052  HGB 12.5*  HCT 37.8*  PLT 178  CREATININE 1.10  TROPONINI 0.38*    Estimated Creatinine Clearance: 72.3 mL/min (by C-G formula based on Cr of 1.1).   Medical History: Past Medical History  Diagnosis Date  . Hypertension   . GERD (gastroesophageal reflux disease)   . 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 Chronic hoarseness     4 to 5 months  . History of radiation therapy 02/16/12-03/26/12    vocal cord /glottis ca    Medications:   (Not in a hospital admission)  Assessment: 87 YOM at [facility] ED to start heparin for elevated troponin of 0.38. Hgb 12.8. Plt wnl. Pt is not on any anticoagulation prior to admission.   Goal of Therapy:  Heparin level 0.3-0.7 units/ml Monitor platelets by anticoagulation protocol: Yes   Plan:  -Heparin 4000 units bolus followed by heparin 1150 units/hr -F/u 0500 HL  -Monitor daily HL, CBC and s/s of bleeding   Albertina Parr, PharmD.  Clinical Pharmacist Pager [phone]

## 2014-02-10 NOTE — ED Notes (Signed)
Report given to [facility] 

## 2014-02-11 ENCOUNTER — Encounter (HOSPITAL_COMMUNITY): Payer: Self-pay | Admitting: General Practice

## 2014-02-11 DIAGNOSIS — E876 Hypokalemia: Secondary | ICD-10-CM

## 2014-02-11 DIAGNOSIS — I379 Nonrheumatic pulmonary valve disorder, unspecified: Secondary | ICD-10-CM

## 2014-02-11 DIAGNOSIS — R55 Syncope and collapse: Secondary | ICD-10-CM

## 2014-02-11 DIAGNOSIS — N390 Urinary tract infection, site not specified: Secondary | ICD-10-CM

## 2014-02-11 DIAGNOSIS — R7989 Other specified abnormal findings of blood chemistry: Secondary | ICD-10-CM

## 2014-02-11 DIAGNOSIS — I10 Essential (primary) hypertension: Secondary | ICD-10-CM | POA: Insufficient documentation

## 2014-02-11 DIAGNOSIS — R778 Other specified abnormalities of plasma proteins: Secondary | ICD-10-CM | POA: Diagnosis present

## 2014-02-11 DIAGNOSIS — D649 Anemia, unspecified: Secondary | ICD-10-CM

## 2014-02-11 LAB — BASIC METABOLIC PANEL
ANION GAP: 14 (ref 5–15)
BUN: 14 mg/dL (ref 6–23)
CO2: 25 meq/L (ref 19–32)
Calcium: 9 mg/dL (ref 8.4–10.5)
Chloride: 99 mEq/L (ref 96–112)
Creatinine, Ser: 0.91 mg/dL (ref 0.50–1.35)
GFR calc Af Amer: >90 mL/min (ref >90)
GFR calc non Af Amer: 88 mL/min — ABNORMAL LOW (ref >90)
GLUCOSE: 136 mg/dL — AB (ref 70–99)
POTASSIUM: 3.2 meq/L — AB (ref 3.7–5.3)
Sodium: 138 mEq/L (ref 137–147)

## 2014-02-11 LAB — LIPID PANEL
CHOL/HDL RATIO: 2.4 ratio
Cholesterol: 129 mg/dL (ref 0–200)
HDL: 53 mg/dL (ref >39)
LDL Cholesterol: 68 mg/dL (ref 0–99)
TRIGLYCERIDES: 42 mg/dL (ref <150)
VLDL: 8 mg/dL (ref 0–40)

## 2014-02-11 LAB — CBC
HCT: 34 % — ABNORMAL LOW (ref 39.0–52.0)
HEMATOCRIT: 33.2 % — AB (ref 39.0–52.0)
HEMOGLOBIN: 11 g/dL — AB (ref 13.0–17.0)
Hemoglobin: 11.2 g/dL — ABNORMAL LOW (ref 13.0–17.0)
MCH: 28.3 pg (ref 26.0–34.0)
MCH: 28.5 pg (ref 26.0–34.0)
MCHC: 32.9 g/dL (ref 30.0–36.0)
MCHC: 33.1 g/dL (ref 30.0–36.0)
MCV: 85.9 fL (ref 78.0–100.0)
MCV: 86 fL (ref 78.0–100.0)
Platelets: 235 10*3/uL (ref 150–400)
Platelets: 210 10*3/uL (ref 150–400)
RBC: 3.96 MIL/uL — AB (ref 4.22–5.81)
RBC: 3.86 MIL/uL — ABNORMAL LOW (ref 4.22–5.81)
RDW: 13.5 % (ref 11.5–15.5)
RDW: 13.5 % (ref 11.5–15.5)
WBC: 9.6 10*3/uL (ref 4.0–10.5)
WBC: 8.6 10*3/uL (ref 4.0–10.5)

## 2014-02-11 LAB — TSH: TSH: 0.871 u[IU]/mL (ref 0.350–4.500)

## 2014-02-11 LAB — HEMOGLOBIN A1C
Hgb A1c MFr Bld: 6.8 % — ABNORMAL HIGH (ref <5.7)
Mean Plasma Glucose: 148 mg/dL — ABNORMAL HIGH (ref <117)

## 2014-02-11 LAB — PROTIME-INR
INR: 1.29 (ref 0.00–1.49)
PROTHROMBIN TIME: 16.2 s — AB (ref 11.6–15.2)

## 2014-02-11 LAB — TROPONIN I: Troponin I: <0.3 ng/mL (ref <0.30)

## 2014-02-11 LAB — MAGNESIUM: Magnesium: 1.8 mg/dL (ref 1.5–2.5)

## 2014-02-11 LAB — HEPARIN LEVEL (UNFRACTIONATED): Heparin Unfractionated: 0.56 IU/mL (ref 0.30–0.70)

## 2014-02-11 MED ORDER — ATORVASTATIN CALCIUM 80 MG PO TABS
80.0000 mg | ORAL_TABLET | Freq: Every day | ORAL | Status: DC
  Filled 2014-02-11: qty 1

## 2014-02-11 MED ORDER — CIPROFLOXACIN HCL 500 MG PO TABS
500.0000 mg | ORAL_TABLET | Freq: Two times a day (BID) | ORAL | Status: DC
  Administered 2014-02-11: 500 mg via ORAL
  Filled 2014-02-11 (×3): qty 1

## 2014-02-11 MED ORDER — METOPROLOL TARTRATE 12.5 MG HALF TABLET
12.5000 mg | ORAL_TABLET | Freq: Two times a day (BID) | ORAL | Status: DC
Start: 2014-02-11 — End: 2014-02-11
  Administered 2014-02-11 (×2): 12.5 mg via ORAL
  Filled 2014-02-11 (×3): qty 1

## 2014-02-11 MED ORDER — ONDANSETRON HCL 4 MG/2ML IJ SOLN: 4.0000 mg | Freq: Four times a day (QID) | INTRAMUSCULAR | Status: DC | PRN

## 2014-02-11 MED ORDER — LISINOPRIL 20 MG PO TABS
20.0000 mg | ORAL_TABLET | Freq: Every evening | ORAL | Status: DC
  Filled 2014-02-11: qty 1

## 2014-02-11 MED ORDER — NITROGLYCERIN 0.4 MG SL SUBL: 0.4000 mg | SUBLINGUAL_TABLET | SUBLINGUAL | Status: DC | PRN

## 2014-02-11 MED ORDER — ACETAMINOPHEN 325 MG PO TABS: 650.0000 mg | ORAL_TABLET | ORAL | Status: DC | PRN

## 2014-02-11 MED ORDER — CIPROFLOXACIN HCL 500 MG PO TABS: 500.0000 mg | ORAL_TABLET | Freq: Two times a day (BID) | ORAL | Status: AC

## 2014-02-11 MED ORDER — POTASSIUM CHLORIDE CRYS ER 20 MEQ PO TBCR
40.0000 meq | EXTENDED_RELEASE_TABLET | Freq: Once | ORAL | Status: AC
  Administered 2014-02-11: 40 meq via ORAL
  Filled 2014-02-11: qty 2

## 2014-02-11 MED ORDER — ASPIRIN EC 81 MG PO TBEC
81.0000 mg | DELAYED_RELEASE_TABLET | Freq: Every day | ORAL | Status: DC
  Administered 2014-02-11: 81 mg via ORAL
  Filled 2014-02-11: qty 1

## 2014-02-11 MED ORDER — AMLODIPINE BESYLATE 10 MG PO TABS
10.0000 mg | ORAL_TABLET | Freq: Every day | ORAL | Status: DC
  Administered 2014-02-11: 10 mg via ORAL
  Filled 2014-02-11: qty 1

## 2014-02-11 NOTE — Progress Notes (Signed)
SUBJECTIVE: The patient is doing well today.  At this time, he denies chest pain, shortness of breath, or any new concerns.  He is at baseline.  Complains of foul smelling urine and urinary symptoms.  Marland Kitchen amLODipine  10 mg Oral Daily  . aspirin EC  81 mg Oral Daily  . atorvastatin  80 mg Oral q1800  . ciprofloxacin  500 mg Oral BID  . lisinopril  20 mg Oral QPM      OBJECTIVE: Physical Exam: Filed Vitals:   02/10/14 2346 02/11/14 0439 02/11/14 1038 02/11/14 1039  BP: 143/83 123/67 122/72   Pulse: 101 68  80  Temp: 97.9 F (36.6 C) 98.6 F (37 C)    TempSrc: Oral Oral    Resp: 18 18    Height: 5\' 11"  (1.803 m)     Weight: 192 lb 6.4 oz (87.272 kg)     SpO2: 100% 100%      Intake/Output Summary (Last 24 hours) at 02/11/14 1225 Last data filed at 02/11/14 1041  Gross per 24 hour  Intake  76.28 ml  Output    400 ml  Net -323.72 ml    Telemetry reveals sinus rhythm, no arrhythmias  GEN- The patient is well appearing, alert and oriented x 3 today.   Head- normocephalic, atraumatic Eyes-  Sclera clear, conjunctiva pink Ears- hearing intact Oropharynx- clear Neck- supple  Lungs- Clear to ausculation bilaterally, normal work of breathing Heart- Regular rate and rhythm, no murmurs, rubs or gallops, PMI not laterally displaced GI- soft, NT, ND, + BS Extremities- no clubbing, cyanosis, or edema Skin- no rash or lesion Psych- euthymic mood, full affect Neuro- strength and sensation are intact  LABS: Basic Metabolic Panel:  Recent Labs  02/10/14 2052 02/11/14 0439  NA 138 138  K 3.2* 3.2*  CL 99 99  CO2 23 25  GLUCOSE 198* 136*  BUN 17 14  CREATININE 1.10 0.91  CALCIUM 9.4 9.0  MG  --  1.8   Liver Function Tests: No results for input(s): AST, ALT, ALKPHOS, BILITOT, PROT, ALBUMIN in the last 72 hours. No results for input(s): LIPASE, AMYLASE in the last 72 hours. CBC:  Recent Labs  02/10/14 2052 02/11/14 0439 02/11/14 1133  WBC 14.4* 9.6 8.6    NEUTROABS 12.6*  --   --   HGB 12.5* 11.2* 11.0*  HCT 37.8* 34.0* 33.2*  MCV 85.3 85.9 86.0  PLT 178 235 210   Cardiac Enzymes:  Recent Labs  02/10/14 2052 02/11/14 0439 02/11/14 1035  TROPONINI 0.38* <0.30 <0.30   BNP: Invalid input(s): POCBNP D-Dimer: No results for input(s): DDIMER in the last 72 hours. Hemoglobin A1C: No results for input(s): HGBA1C in the last 72 hours. Fasting Lipid Panel:  Recent Labs  02/11/14 0439  CHOL 129  HDL 53  LDLCALC 68  TRIG 42  CHOLHDL 2.4   Thyroid Function Tests:  Recent Labs  02/11/14 0439  TSH 0.871   Anemia Panel: No results for input(s): VITAMINB12, FOLATE, FERRITIN, TIBC, IRON, RETICCTPCT in the last 72 hours.  RADIOLOGY: Dg Chest 2 View  02/10/2014   CLINICAL DATA:  Near-syncope, elevated troponin  EXAM: CHEST  2 VIEW  COMPARISON:  01/15/2012  FINDINGS: Cardiomediastinal silhouette is stable. No acute infiltrate or pleural effusion. No pulmonary edema. Bony thorax is unremarkable.  IMPRESSION: No active cardiopulmonary disease.   Electronically Signed   By: Lahoma Crocker M.D.   On: 02/10/2014 22:47    ASSESSMENT AND PLAN:  Active Problems:  Near syncope   Elevated troponin  1. UTI cipro 500mg  BID Urine culture is pending  2. Hypertension Stable No change required today  3. Presyncope Likely due to decreased oral input and UTI Awaiting echo.  If echo is low risk, no further workup is advised  4. Elevated troponin Repeat value normal No ischemic symptoms If echo is normal, would discharge today  Thompson Grayer, MD 02/11/2014 12:25 PM

## 2014-02-11 NOTE — Progress Notes (Signed)
UR completed 

## 2014-02-11 NOTE — Progress Notes (Signed)
Echo Lab  2D Echocardiogram completed.  [place], RDCS 02/11/2014 2:24 PM

## 2014-02-11 NOTE — Progress Notes (Signed)
Admitted pt to rm 3E27 from [facility] via [facility], oriented to room, call bell placed within reach. Admission assessment done, orders carried out.   02/10/14 2346  Vitals  Temp 97.9 F (36.6 C)  Temp Source Oral  BP (!) 143/83 mmHg  BP Location Left Arm  Pulse Rate (!) 101  Pulse Rate Source Dinamap  Resp 18  Oxygen Therapy  SpO2 100 %  O2 Device Room Air  Height and Weight  Height 5\' 11"  (1.803 m)  Weight 87.272 kg (192 lb 6.4 oz) (scale b)  Type of Scale Used Standing  BSA (Calculated - sq m) 2.09 sq meters  BMI (Calculated) 26.9  Weight in (lb) to have BMI = 25 178.9

## 2014-02-11 NOTE — Discharge Instructions (Signed)
Please establish with primary care for follow up or follow up with urgent care in the next 1 week. You will need to follow up on a slightly low blood count (hemoglobin), low potassium and bladder infection.

## 2014-02-11 NOTE — Discharge Summary (Addendum)
Discharge Summary   Patient ID: Jack Robinson, MRN: [id number], DOB/AGE: Nov 14, 1950 63 y.o.  Admit date: 02/10/2014 Discharge date: 02/11/2014   Primary Care Physician:  Irven Shelling   Primary Cardiologist:  Dr. Thompson Grayer   Reason for Admission:  Elevated Troponin   Primary Discharge Diagnoses:  Principal Problem:   Elevated troponin Active Problems:   Near syncope   UTI (urinary tract infection)   Essential hypertension   Normocytic anemia   Hypokalemia     Wt Readings from Last 3 Encounters:  02/10/14 192 lb 6.4 oz (87.272 kg)  10/18/12 182 lb 12.8 oz (82.918 kg)  07/19/12 178 lb 12.8 oz (81.103 kg)    Secondary Discharge Diagnoses:   Past Medical History  Diagnosis Date  . Hypertension   . GERD (gastroesophageal reflux disease)   . 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 Chronic hoarseness     4 to 5 months  . 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 Hx of echocardiogram     a. Echo (11/15):  mild LVH, EF 60-65%, Gr 2 DD, trivial AI, mild BAE      Allergies:   No Known Allergies    Procedures Performed This Admission:    None    Hospital Course:  Jack Robinson is a 63 y.o. male with a hx of oral CA, HTN, HL.  On the date of admission, he presented to a local health clinic with dysuria and developed lightheadedness and near syncope accompanied by diaphoresis while waiting. He was transferred to [place].  Troponin was mildly elevated at 0.38.  He was transferred to [facility] for further evaluation and management.  He was placed on Heparin gtt and Cipro for UTI.  All subsequent Troponins were normal.  He was evaluated by Dr. Thompson Grayer this AM.  He was not having any chest pain.  Near syncope was felt to be related to poor oral intake and UTI.  It was felt that he could be DC to home if his echocardiogram was normal.  This returned with normal LVF, moderate diastolic  dysfunction, mild BAE.  He is DC to home in stable condition.  Of note, K+ 3.2.  Will give a dose of K+ 40 mEq at DC.  He can FU with his PCP regarding his hypokalemia and his UTI.    Discharge Vitals:   Blood pressure 123/70, pulse 78, temperature 98.9 F (37.2 C), temperature source Oral, resp. rate 20, height 5\' 11"  (1.803 m), weight 192 lb 6.4 oz (87.272 kg), SpO2 98 %.   Labs:   Recent Labs  02/10/14 2052 02/11/14 0439 02/11/14 1133  WBC 14.4* 9.6 8.6  HGB 12.5* 11.2* 11.0*  HCT 37.8* 34.0* 33.2*  MCV 85.3 85.9 86.0  PLT 178 235 210     Recent Labs  02/10/14 2052 02/11/14 0439  NA 138 138  K 3.2* 3.2*  CL 99 99  CO2 23 25  BUN 17 14  CREATININE 1.10 0.91  CALCIUM 9.4 9.0     Recent Labs  02/10/14 2052 02/11/14 0439 02/11/14 1035  TROPONINI 0.38* <0.30 <0.30    Lab Results  Component Value Date   CHOL 129 02/11/2014   HDL 53 02/11/2014   LDLCALC 68 02/11/2014   TRIG 42 02/11/2014    No results found for: DDIMER  Lab Results  Component Value Date   TSH 0.871 02/11/2014  Recent Labs  02/11/14 0439  INR 1.29     Diagnostic Procedures and Studies:  Dg Chest 2 View  02/10/2014   CLINICAL DATA:  Near-syncope, elevated troponin  EXAM: CHEST  2 VIEW  COMPARISON:  01/15/2012  FINDINGS: Cardiomediastinal silhouette is stable. No acute infiltrate or pleural effusion. No pulmonary edema. Bony thorax is unremarkable.  IMPRESSION: No active cardiopulmonary disease.   Electronically Signed   By: Lahoma Crocker M.D.   On: 02/10/2014 22:47     2D Echocardiogram 02/11/14:  Study Conclusions  - Left ventricle: The cavity size was normal. Wall thickness was increased in a pattern of mild LVH. Systolic function was normal. The estimated ejection fraction was in the range of 60% to 65%. Wall motion was normal; there were no regional wall motion abnormalities. Features are consistent with a pseudonormal left ventricular filling pattern, with  concomitant abnormal relaxation and increased filling pressure (grade 2 diastolic dysfunction). - Aortic valve: There was trivial regurgitation. - Left atrium: The atrium was mildly dilated. - Right atrium: The atrium was mildly dilated.   Disposition:   Pt is being discharged home today in good condition.  Follow-up Plans & Appointments      Follow-up Information    Follow up with Primary Care or Urgent Care. Schedule an appointment as soon as possible for a visit in 1 week.      Follow up with Thompson Grayer, MD.   Specialty:  Cardiology   Why:  As needed (cardiology)   Contact information:   [place] [address] [phone]       Discharge Medications    Medication List    TAKE these medications        amLODipine 10 MG tablet  Commonly known as:  NORVASC  Take 10 mg by mouth daily.     aspirin EC 81 MG tablet  Take 81 mg by mouth daily.     ciprofloxacin 500 MG tablet  Commonly known as:  CIPRO  Take 1 tablet (500 mg total) by mouth 2 (two) times daily.     ezetimibe-simvastatin 10-20 MG per tablet  Commonly known as:  VYTORIN  Take 1 tablet by mouth daily.     lisinopril 20 MG tablet  Commonly known as:  PRINIVIL,ZESTRIL  Take 20 mg by mouth every evening.     lisinopril-hydrochlorothiazide 20-25 MG per tablet  Commonly known as:  PRINZIDE,ZESTORETIC  Take 1 tablet by mouth every morning.     potassium chloride SA 20 MEQ tablet  Commonly known as:  K-DUR,KLOR-CON  Take 20 mEq by mouth daily.         Outstanding Labs/Studies  1. Patient needs to FU with PCP for anemia, hypokalemia and UTI.   Duration of Discharge Encounter: Greater than 30 minutes including physician and PA time.  Signed, Richardson Dopp, PA-C   02/11/2014 3:55 PM     Thompson Grayer MD

## 2014-02-11 NOTE — H&P (Cosign Needed)
Patient ID: Jack Robinson MRN: [id number], DOB/AGE: [birth year]   Admit date: 02/10/2014   Primary Physician: No primary care provider on file. Primary Cardiologist: None  CC: Near syncope, elevated cTn  Problem List  Past Medical History  Diagnosis Date  . Hypertension   . GERD (gastroesophageal reflux disease)   . 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 Chronic hoarseness     4 to 5 months  . History of radiation therapy 02/16/12-03/26/12    vocal cord /glottis ca    Past Surgical History  Procedure Laterality Date  . Colonoscopy w/ polypectomy  01/2010    every 5 years  . Microlaryngoscopy  01/19/2012    Procedure: MICROLARYNGOSCOPY;  Surgeon: Jerrell Belfast, MD;  Location: [facility];  Service: ENT;  Laterality: Left;  MICROLARYNGOSCOPY WITH EXCISON OF VOCAL CORD POLYP  . Vocal cord biopsy  01/19/12    right polyp bx=/ invasive squamous cell ca     Allergies  No Known Allergies  HPI 63M with a history of oral CA, HTN, HLD who presented initially with dysuria to a local health clinic. While he was waiting there, he felt lightheaded and presyncopal. This was accompanied by diaphroresis. He reported that he had eaten very little all day today. As a result, he was advised to go to [place] for additional evaluation. There, given this history of lightheadedness, a troponin was checked and was abnormal to 0.38. Importantly, he denied chest pain, shortness of breath, palpitations, syncope, or any other concerning symptoms. However, given the elevated troponin, he was transferred to [facility] for additional evaluation and management.  Risk factors: HTN - Y HLD - Y DM - not diagnosed Smoker - N Family history - N Cardiac evaluation - N  Home Medications  Prior to Admission medications   Medication Sig Start Date End Date Taking? Authorizing Provider  amLODipine (NORVASC) 10 MG tablet Take 10 mg by mouth daily.    Historical Provider,  MD  aspirin EC 81 MG tablet Take 81 mg by mouth daily.    Historical Provider, MD  atorvastatin (LIPITOR) 40 MG tablet Take 40 mg by mouth daily. Takes 1/2 tablet at bedtime secondary to increased arm weakness.    Historical Provider, MD  lisinopril (PRINIVIL,ZESTRIL) 20 MG tablet Take 20 mg by mouth every evening.    Historical Provider, MD  lisinopril-hydrochlorothiazide (PRINZIDE,ZESTORETIC) 20-25 MG per tablet Take 1 tablet by mouth every morning.     Historical Provider, MD  potassium chloride SA (K-DUR,KLOR-CON) 20 MEQ tablet Take 20 mEq by mouth daily.    Historical Provider, MD    Family History  History reviewed. No pertinent family history.  Social History  History   Social History  . Marital Status: Married    Spouse Name: N/A    Number of Children: 1  . Years of Education: N/A   Occupational History  .  [employer] History Main Topics  . Smoking status: Never Smoker   . Smokeless tobacco: Never Used  . Alcohol Use: Yes     Comment: social  . Drug Use: No  . Sexual Activity: Not on file   Other Topics Concern  . Not on file   Social History Narrative     Review of Systems General:  No chills, fever, night sweats or weight changes.  Cardiovascular:  No chest pain, dyspnea on exertion, edema, orthopnea, palpitations, paroxysmal nocturnal dyspnea. Dermatological: No rash, lesions/masses  Respiratory: No cough, dyspnea Urologic: No hematuria, +dysuria Abdominal:   No nausea, vomiting, diarrhea, bright red blood per rectum, melena, or hematemesis Neurologic:  No visual changes, wkns, changes in mental status. All other systems reviewed and are otherwise negative except as noted above.  Physical Exam  Blood pressure 143/83, pulse 101, temperature 97.9 F (36.6 C), temperature source Oral, resp. rate 18, height 5\' 11"  (1.803 m), weight 192 lb 6.4 oz (87.272 kg), SpO2 100 %.  General: Pleasant, NAD Psych: Normal affect. Neuro: Alert and oriented X  3. Moves all extremities spontaneously. HEENT: Normal  Neck: Supple without bruits or JVD. Lungs:  Resp regular and unlabored, CTA. Heart: RRR no s3, s4, or murmurs. Abdomen: Soft, non-tender, non-distended, BS + x 4.  Extremities: No clubbing, cyanosis or edema. DP/PT/Radials 2+ and equal bilaterally.  Labs  Troponin [facility] of Care Test)  Recent Labs  02/10/14 2052  TROPONINI 0.38*   Lab Results  Component Value Date   WBC 14.4* 02/10/2014   HGB 12.5* 02/10/2014   HCT 37.8* 02/10/2014   MCV 85.3 02/10/2014   PLT 178 02/10/2014    Recent Labs Lab 02/10/14 2052  NA 138  K 3.2*  CL 99  CO2 23  BUN 17  CREATININE 1.10  CALCIUM 9.4  GLUCOSE 198*   No results found for: CHOL, HDL, LDLCALC, TRIG No results found for: DDIMER   Radiology/Studies  Dg Chest 2 View  02/10/2014   CLINICAL DATA:  Near-syncope, elevated troponin  EXAM: CHEST  2 VIEW  COMPARISON:  01/15/2012  FINDINGS: Cardiomediastinal silhouette is stable. No acute infiltrate or pleural effusion. No pulmonary edema. Bony thorax is unremarkable.  IMPRESSION: No active cardiopulmonary disease.   Electronically Signed   By: Lahoma Crocker M.D.   On: 02/10/2014 22:47    ECG NSR @ 98bpm, normal axis, intervals, no acute ST-TW changes  ASSESSMENT AND PLAN 18M with a history of oral CA, HTN, HLD who presented initially with dysuria and found to have elevated cTn in the setting of presyncope. Unclear etiology for his elevated cTn. He denies any overt cardiac symptoms and there appears to be a reasonable explanation for his lightheadness upon evaluation for his dysuria. He does have significant risk factors, however, and he may have diabetes, which may provide partial explanation for his BG 198 and his recurrent dysuria. -continue ASA -heparin gtt for now as it had been started but likely can d/c in AM if cTn is decreasing -serial cTn - if continuing to increase can consider cath; if lower, would recommend noninvasive  management -TTE in AM -low-dose metoprolol -continue ACEi -check TSH, A1c, lipids -NPO p MN  2. HTN: Continue amlodipine, lisinopril, holding HCTZ for now  3. HLD: increased atorvastatin to 80mg  for now  FULL CODE  Signed, Raliegh Ip, MD MPH 02/11/2014, 12:28 AM

## 2014-02-12 LAB — URINE CULTURE: Colony Count: 10000

## 2014-09-13 IMAGING — CR DG CHEST 2V
2 series · 2 of 2 positions shown · non-contrast
Comparison: None.

CLINICAL DATA: Preoperative evaluation.  Controlled hypertension.
Nonsmoker

CHEST - 2 VIEW

[view not recorded (1 of 2)]
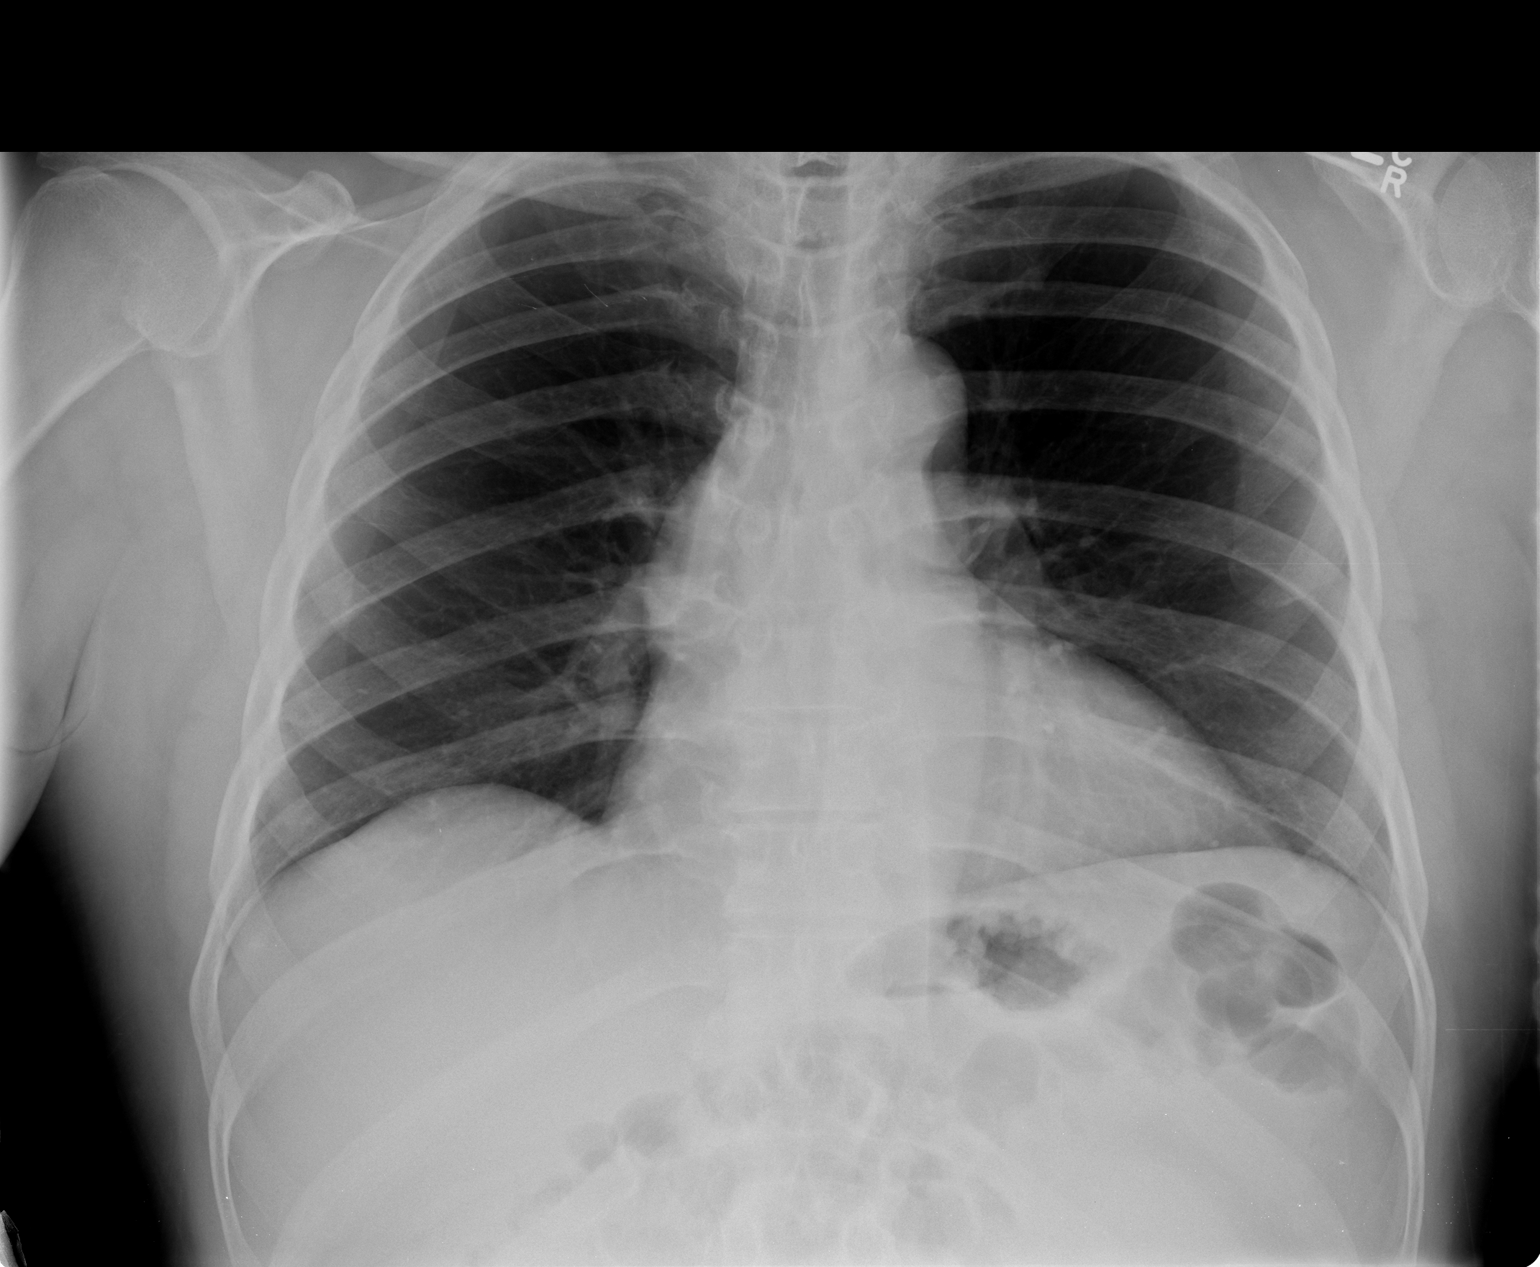

[view not recorded (2 of 2)]
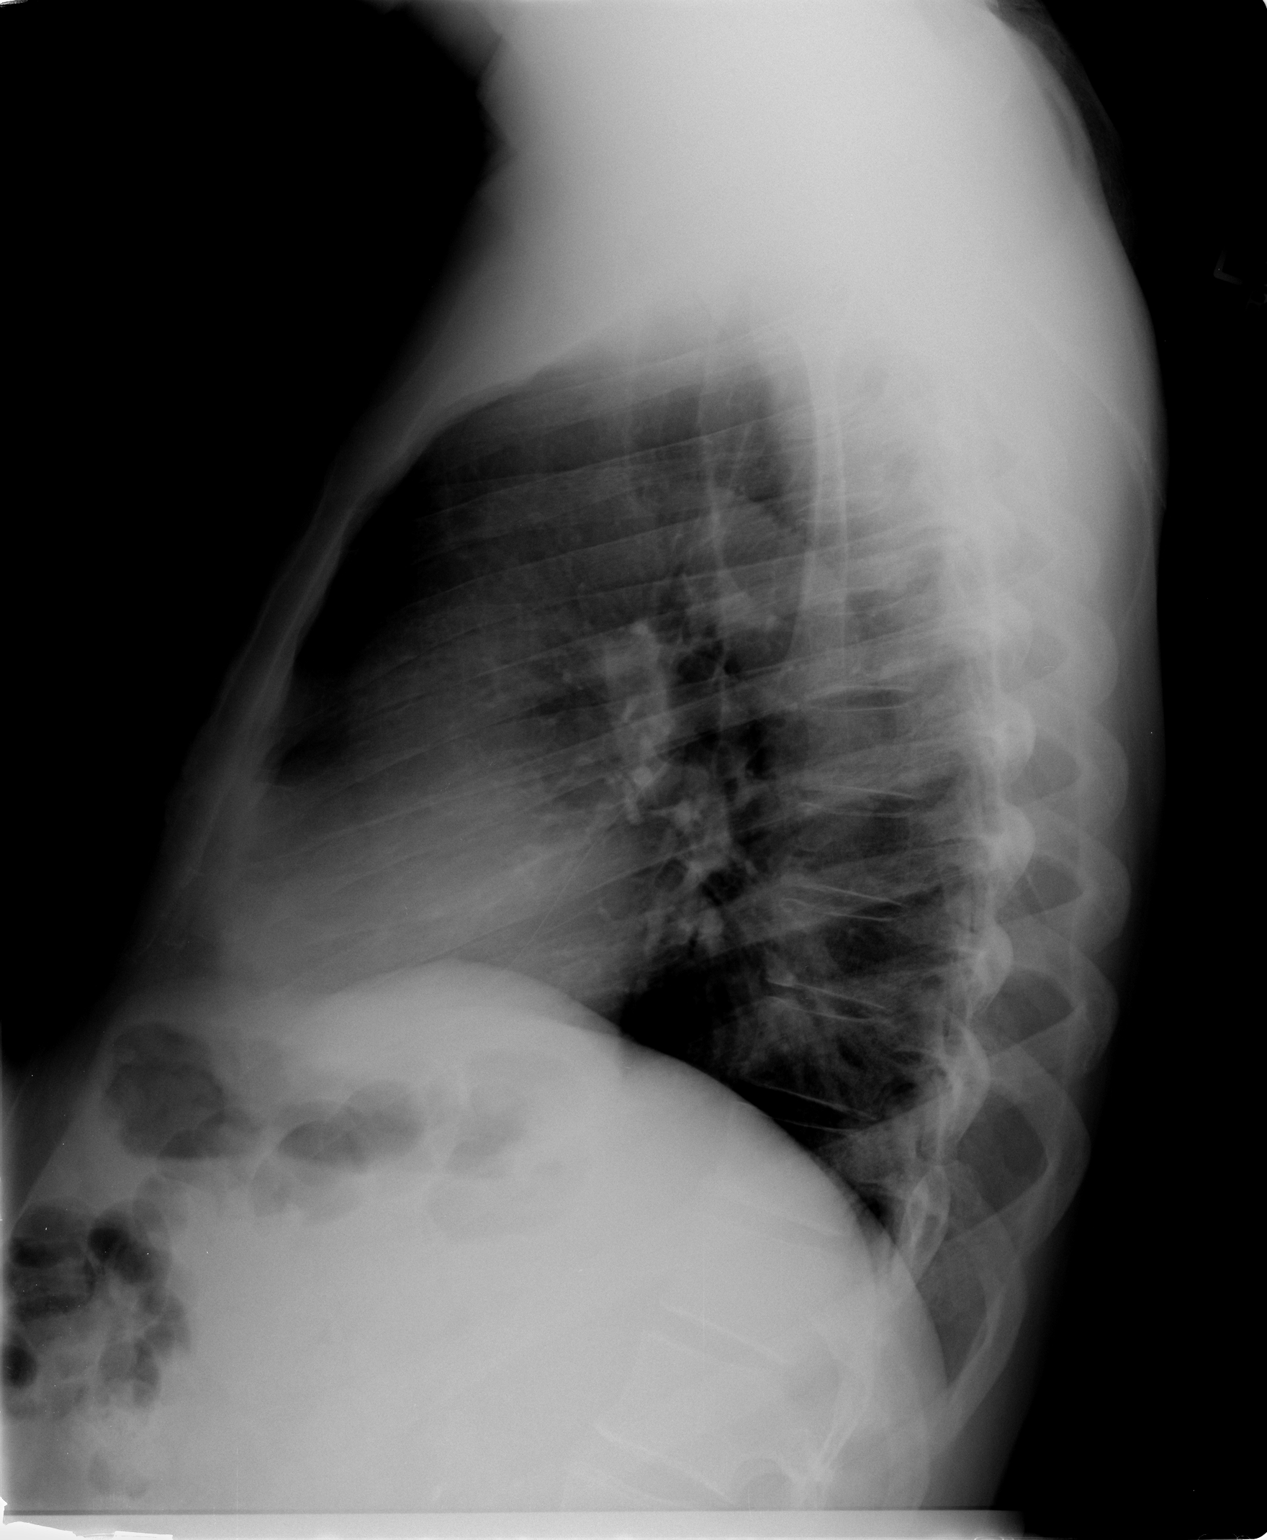

[2 of 2 positions shown; findings below may reference images not displayed]

FINDINGS: Low lung volumes are present.  Taking this into
consideration heart size is within normal limits.  Mild prominence
of the ascending aorta is seen and would correlate with the history
of hypertension.  The mediastinum is otherwise unremarkable.

The lung fields are clear with no signs of focal infiltrate or
congestive failure.  No pleural fluid or significant peribronchial
cuffing is noted.  Bony structures appear intact.
IMPRESSION: No worrisome focal or acute cardiopulmonary abnormality seen.

## 2014-10-12 ENCOUNTER — Other Ambulatory Visit: Payer: Self-pay | Admitting: Otolaryngology

## 2014-10-12 NOTE — Pre-Procedure Instructions (Signed)
Jack Robinson  10/12/2014      [facility] [place] [place] [address] Phone: [phone] Fax: [phone]    Your procedure is scheduled on Monday, October 16, 2014  Report to [facility] Admitting at 5:30 A.M.  Call this number if you have problems the morning of surgery:  [phone]   Remember:  Do not eat food or drink liquids after midnight Sunday, October 15, 2014  Take these medicines the morning of surgery with A SIP OF WATER :amLODipine (NORVASC)  Stop taking Aspirin, vitamins and herbal medications. Do not take any NSAIDs ie: Ibuprofen, Advil, Naproxen or any medication containing Aspirin; stop now.   Do not wear jewelry, make-up or nail polish.  Do not wear lotions, powders, or perfumes.  You may not wear deodorant.  Do not shave 48 hours prior to surgery.  Men may shave face and neck.  Do not bring valuables to the hospital.  [facility] is not responsible for any belongings or valuables.  Contacts, dentures or bridgework may not be worn into surgery.  Leave your suitcase in the car.  After surgery it may be brought to your room.  For patients admitted to the hospital, discharge time will be determined by your treatment team.  Patients discharged the day of surgery will not be allowed to drive home.   Name and phone number of your driver:   Special instructions: Special Instructions:Special Instructions: [facility] - Preparing for Surgery  Before surgery, you can play an important role.  Because skin is not sterile, your skin needs to be as free of germs as possible.  You can reduce the number of germs on you skin by washing with CHG (chlorahexidine gluconate) soap before surgery.  CHG is an antiseptic cleaner which kills germs and bonds with the skin to continue killing germs even after washing.  Please DO NOT use if you have an allergy to CHG or antibacterial soaps.  If your skin  becomes reddened/irritated stop using the CHG and inform your nurse when you arrive at Short Stay.  Do not shave (including legs and underarms) for at least 48 hours prior to the first CHG shower.  You may shave your face.  Please follow these instructions carefully:   1.  Shower with CHG Soap the night before surgery and the morning of Surgery.  2.  If you choose to wash your hair, wash your hair first as usual with your normal shampoo.  3.  After you shampoo, rinse your hair and body thoroughly to remove the Shampoo.  4.  Use CHG as you would any other liquid soap.  You can apply chg directly  to the skin and wash gently with scrungie or a clean washcloth.  5.  Apply the CHG Soap to your body ONLY FROM THE NECK DOWN.  Do not use on open wounds or open sores.  Avoid contact with your eyes, ears, mouth and genitals (private parts).  Wash genitals (private parts) with your normal soap.  6.  Wash thoroughly, paying special attention to the area where your surgery will be performed.  7.  Thoroughly rinse your body with warm water from the neck down.  8.  DO NOT shower/wash with your normal soap after using and rinsing off the CHG Soap.  9.  Pat yourself dry with a clean towel.  10.  Wear clean pajamas.            11.  Place clean sheets on your bed the night of your first shower and do not sleep with pets.  Day of Surgery  Do not apply any lotions/deodorants the morning of surgery.  Please wear clean clothes to the hospital/surgery center.  Please read over the following fact sheets that you were given. Pain Booklet, Coughing and Deep Breathing and Surgical Site Infection Prevention

## 2014-10-13 ENCOUNTER — Encounter (HOSPITAL_COMMUNITY)
Admission: RE | Admit: 2014-10-13 | Discharge: 2014-10-13 | Disposition: A | Discharge status: Home / Self Care | Payer: 59 | Source: Ambulatory Visit | Attending: Otolaryngology | Admitting: Otolaryngology

## 2014-10-13 ENCOUNTER — Encounter (HOSPITAL_COMMUNITY): Payer: Self-pay

## 2014-10-13 DIAGNOSIS — D141 Benign neoplasm of larynx: Secondary | ICD-10-CM | POA: Diagnosis not present

## 2014-10-13 DIAGNOSIS — Z923 Personal history of irradiation: Secondary | ICD-10-CM | POA: Diagnosis not present

## 2014-10-13 DIAGNOSIS — I10 Essential (primary) hypertension: Secondary | ICD-10-CM | POA: Diagnosis not present

## 2014-10-13 DIAGNOSIS — J383 Other diseases of vocal cords: Secondary | ICD-10-CM | POA: Diagnosis present

## 2014-10-13 DIAGNOSIS — Z7982 Long term (current) use of aspirin: Secondary | ICD-10-CM | POA: Diagnosis not present

## 2014-10-13 DIAGNOSIS — K219 Gastro-esophageal reflux disease without esophagitis: Secondary | ICD-10-CM | POA: Diagnosis not present

## 2014-10-13 DIAGNOSIS — Z8589 Personal history of malignant neoplasm of other organs and systems: Secondary | ICD-10-CM | POA: Diagnosis not present

## 2014-10-13 LAB — BASIC METABOLIC PANEL
Anion gap: 9 (ref 5–15)
BUN: 14 mg/dL (ref 6–20)
CHLORIDE: 106 mmol/L (ref 101–111)
CO2: 27 mmol/L (ref 22–32)
Calcium: 9.6 mg/dL (ref 8.9–10.3)
Creatinine, Ser: 1.09 mg/dL (ref 0.61–1.24)
GFR calc Af Amer: >60 mL/min (ref >60)
Glucose, Bld: 138 mg/dL — ABNORMAL HIGH (ref 65–99)
POTASSIUM: 3.1 mmol/L — AB (ref 3.5–5.1)
SODIUM: 142 mmol/L (ref 135–145)

## 2014-10-13 LAB — CBC
HCT: 41.4 % (ref 39.0–52.0)
Hemoglobin: 13.6 g/dL (ref 13.0–17.0)
MCH: 28.3 pg (ref 26.0–34.0)
MCHC: 32.9 g/dL (ref 30.0–36.0)
MCV: 86.3 fL (ref 78.0–100.0)
Platelets: 194 10*3/uL (ref 150–400)
RBC: 4.8 MIL/uL (ref 4.22–5.81)
RDW: 13.3 % (ref 11.5–15.5)
WBC: 5.5 10*3/uL (ref 4.0–10.5)

## 2014-10-13 NOTE — Progress Notes (Signed)
Call has been made to Dr. Victorio Palm office for orders to be signed  & released.  Call was made on 7/14//2016

## 2014-10-16 ENCOUNTER — Encounter (HOSPITAL_COMMUNITY): Payer: Self-pay | Admitting: *Deleted

## 2014-10-16 ENCOUNTER — Ambulatory Visit (HOSPITAL_COMMUNITY)
Admission: RE | Admit: 2014-10-16 | Discharge: 2014-10-16 | Disposition: A | Discharge status: Home / Self Care | Payer: 59 | Source: Ambulatory Visit | Attending: Otolaryngology | Admitting: Otolaryngology

## 2014-10-16 ENCOUNTER — Ambulatory Visit (HOSPITAL_COMMUNITY): Payer: 59 | Admitting: Anesthesiology

## 2014-10-16 ENCOUNTER — Encounter (HOSPITAL_COMMUNITY)
Admission: RE | Disposition: A | Discharge status: Home / Self Care | Payer: Self-pay | Source: Ambulatory Visit | Attending: Otolaryngology

## 2014-10-16 DIAGNOSIS — Z923 Personal history of irradiation: Secondary | ICD-10-CM | POA: Insufficient documentation

## 2014-10-16 DIAGNOSIS — I10 Essential (primary) hypertension: Secondary | ICD-10-CM | POA: Insufficient documentation

## 2014-10-16 DIAGNOSIS — Z7982 Long term (current) use of aspirin: Secondary | ICD-10-CM | POA: Insufficient documentation

## 2014-10-16 DIAGNOSIS — K219 Gastro-esophageal reflux disease without esophagitis: Secondary | ICD-10-CM | POA: Insufficient documentation

## 2014-10-16 DIAGNOSIS — D141 Benign neoplasm of larynx: Secondary | ICD-10-CM | POA: Insufficient documentation

## 2014-10-16 DIAGNOSIS — Z8589 Personal history of malignant neoplasm of other organs and systems: Secondary | ICD-10-CM | POA: Insufficient documentation

## 2014-10-16 HISTORY — PX: MICROLARYNGOSCOPY: SHX5208

## 2014-10-16 SURGERY — MICROLARYNGOSCOPY
Anesthesia: General | Site: Mouth | Laterality: Left

## 2014-10-16 MED ORDER — GLYCOPYRROLATE 0.2 MG/ML IJ SOLN
INTRAMUSCULAR | Status: AC
  Filled 2014-10-16: qty 3

## 2014-10-16 MED ORDER — SUCCINYLCHOLINE CHLORIDE 20 MG/ML IJ SOLN
INTRAMUSCULAR | Status: DC | PRN
  Administered 2014-10-16: 120 mg via INTRAVENOUS

## 2014-10-16 MED ORDER — SODIUM CHLORIDE 0.9 % IJ SOLN
INTRAMUSCULAR | Status: AC
  Filled 2014-10-16: qty 10

## 2014-10-16 MED ORDER — LACTATED RINGERS IV SOLN
INTRAVENOUS | Status: DC | PRN
  Administered 2014-10-16: 07:00:00 via INTRAVENOUS

## 2014-10-16 MED ORDER — 0.9 % SODIUM CHLORIDE (POUR BTL) OPTIME
TOPICAL | Status: DC | PRN
  Administered 2014-10-16: 1000 mL

## 2014-10-16 MED ORDER — ONDANSETRON HCL 4 MG/2ML IJ SOLN
INTRAMUSCULAR | Status: AC
  Filled 2014-10-16: qty 2

## 2014-10-16 MED ORDER — MIDAZOLAM HCL 2 MG/2ML IJ SOLN
INTRAMUSCULAR | Status: AC
  Filled 2014-10-16: qty 2

## 2014-10-16 MED ORDER — ONDANSETRON HCL 4 MG/2ML IJ SOLN
INTRAMUSCULAR | Status: DC | PRN
  Administered 2014-10-16: 4 mg via INTRAVENOUS

## 2014-10-16 MED ORDER — DEXAMETHASONE SODIUM PHOSPHATE 4 MG/ML IJ SOLN
INTRAMUSCULAR | Status: AC
  Filled 2014-10-16: qty 2

## 2014-10-16 MED ORDER — PHENYLEPHRINE 40 MCG/ML (10ML) SYRINGE FOR IV PUSH (FOR BLOOD PRESSURE SUPPORT)
PREFILLED_SYRINGE | INTRAVENOUS | Status: AC
  Filled 2014-10-16: qty 10

## 2014-10-16 MED ORDER — PROPOFOL 10 MG/ML IV BOLUS
INTRAVENOUS | Status: AC
  Filled 2014-10-16: qty 20

## 2014-10-16 MED ORDER — PHENYLEPHRINE HCL 10 MG/ML IJ SOLN
INTRAMUSCULAR | Status: DC | PRN
  Administered 2014-10-16 (×2): 80 ug via INTRAVENOUS

## 2014-10-16 MED ORDER — ROCURONIUM BROMIDE 50 MG/5ML IV SOLN
INTRAVENOUS | Status: AC
  Filled 2014-10-16: qty 1

## 2014-10-16 MED ORDER — FENTANYL CITRATE (PF) 250 MCG/5ML IJ SOLN
INTRAMUSCULAR | Status: AC
  Filled 2014-10-16: qty 5

## 2014-10-16 MED ORDER — LIDOCAINE HCL (CARDIAC) 20 MG/ML IV SOLN
INTRAVENOUS | Status: DC | PRN
  Administered 2014-10-16: 80 mg via INTRAVENOUS

## 2014-10-16 MED ORDER — LIDOCAINE HCL (CARDIAC) 20 MG/ML IV SOLN
INTRAVENOUS | Status: AC
  Filled 2014-10-16: qty 5

## 2014-10-16 MED ORDER — DEXAMETHASONE SODIUM PHOSPHATE 4 MG/ML IJ SOLN
INTRAMUSCULAR | Status: DC | PRN
  Administered 2014-10-16: 8 mg via INTRAVENOUS

## 2014-10-16 MED ORDER — EPHEDRINE SULFATE 50 MG/ML IJ SOLN
INTRAMUSCULAR | Status: AC
  Filled 2014-10-16: qty 1

## 2014-10-16 MED ORDER — LIDOCAINE HCL 4 % MT SOLN
OROMUCOSAL | Status: DC | PRN
  Administered 2014-10-16: 4 mL via TOPICAL

## 2014-10-16 MED ORDER — SUCCINYLCHOLINE CHLORIDE 20 MG/ML IJ SOLN
INTRAMUSCULAR | Status: AC
  Filled 2014-10-16: qty 1

## 2014-10-16 MED ORDER — EPINEPHRINE HCL 1 MG/ML IJ SOLN
INTRAMUSCULAR | Status: DC | PRN
  Administered 2014-10-16: 30 mL

## 2014-10-16 MED ORDER — MIDAZOLAM HCL 5 MG/5ML IJ SOLN
INTRAMUSCULAR | Status: DC | PRN
  Administered 2014-10-16: 2 mg via INTRAVENOUS

## 2014-10-16 MED ORDER — EPINEPHRINE HCL (NASAL) 0.1 % NA SOLN
NASAL | Status: AC
  Filled 2014-10-16: qty 30

## 2014-10-16 MED ORDER — FENTANYL CITRATE (PF) 100 MCG/2ML IJ SOLN
INTRAMUSCULAR | Status: DC | PRN
  Administered 2014-10-16 (×3): 50 ug via INTRAVENOUS

## 2014-10-16 MED ORDER — PROPOFOL 10 MG/ML IV BOLUS
INTRAVENOUS | Status: DC | PRN
  Administered 2014-10-16: 150 mg via INTRAVENOUS

## 2014-10-16 MED ORDER — PROMETHAZINE HCL 25 MG/ML IJ SOLN: 6.2500 mg | INTRAMUSCULAR | Status: DC | PRN

## 2014-10-16 MED ORDER — FENTANYL CITRATE (PF) 100 MCG/2ML IJ SOLN: 25.0000 ug | INTRAMUSCULAR | Status: DC | PRN

## 2014-10-16 SURGICAL SUPPLY — 33 items
BLADE SURG 15 STRL LF DISP TIS (BLADE) IMPLANT
BLADE SURG 15 STRL SS (BLADE)
CANISTER SUCTION 2500CC (MISCELLANEOUS) ×3 IMPLANT
CONT SPEC 4OZ CLIKSEAL STRL BL (MISCELLANEOUS) IMPLANT
CONT SPECI 4OZ STER CLIK (MISCELLANEOUS) ×3 IMPLANT
COVER MAYO STAND STRL (DRAPES) ×3 IMPLANT
COVER TABLE BACK 60X90 (DRAPES) ×3 IMPLANT
DRAPE PROXIMA HALF (DRAPES) ×3 IMPLANT
DRSG TELFA 3X8 NADH (GAUZE/BANDAGES/DRESSINGS) ×3 IMPLANT
GAUZE SPONGE 4X4 16PLY XRAY LF (GAUZE/BANDAGES/DRESSINGS) ×3 IMPLANT
GLOVE BIO SURGEON STRL SZ7 (GLOVE) ×3 IMPLANT
GLOVE BIOGEL M 7.0 STRL (GLOVE) ×3 IMPLANT
GUARD TEETH (MISCELLANEOUS) ×3 IMPLANT
HOLDER TRACH TUBE VELCRO 19.5 (MISCELLANEOUS) IMPLANT
KIT ROOM TURNOVER OR (KITS) ×3 IMPLANT
MARKER SKIN DUAL TIP RULER LAB (MISCELLANEOUS) IMPLANT
NEEDLE 18GX1X1/2 (RX/OR ONLY) (NEEDLE) ×3 IMPLANT
NEEDLE 22X1 1/2 (OR ONLY) (NEEDLE) IMPLANT
NEEDLE HYPO 25GX1X1/2 BEV (NEEDLE) IMPLANT
NS IRRIG 1000ML POUR BTL (IV SOLUTION) ×3 IMPLANT
PAD ARMBOARD 7.5X6 YLW CONV (MISCELLANEOUS) ×6 IMPLANT
PATTIES SURGICAL .5 X1 (DISPOSABLE) ×3 IMPLANT
SOLUTION ANTI FOG 6CC (MISCELLANEOUS) IMPLANT
SYR BULB 3OZ (MISCELLANEOUS) IMPLANT
TOWEL OR 17X24 6PK STRL BLUE (TOWEL DISPOSABLE) ×3 IMPLANT
TUBE CONNECTING 12'X1/4 (SUCTIONS) ×1
TUBE CONNECTING 12X1/4 (SUCTIONS) ×2 IMPLANT
TUBE TRACH SHILEY  6 DIST  CUF (TUBING) IMPLANT
TUBE TRACH SHILEY 4 DIST CUF (TUBING) IMPLANT
TUBE TRACH SHILEY 6 76FEN UNCF (TUBING) IMPLANT
TUBE TRACH SHILEY 6 FEN UNCF (TUBING)
TUBE TRACH SHILEY 8 DIST CUF (TUBING) IMPLANT
TUBE TRACH SHILEY 8 FEN (TUBING) IMPLANT

## 2014-10-16 NOTE — Op Note (Signed)
Jack Robinson, Jack Robinson               ACCOUNT NO.:  [id number]  MEDICAL RECORD NO.:  [id number]  LOCATION:  [facility]                         FACILITY:  [facility]  PHYSICIAN:  Early Chars. Wilburn Cornelia, M.D.DATE OF BIRTH:  [birth year]  DATE OF PROCEDURE:  10/16/2014 DATE OF DISCHARGE:                              OPERATIVE REPORT   LOCATION:  [facility] Main OR.  PREOPERATIVE DIAGNOSES: 1. Hoarseness. 2. Left vocal cord nodule. 3. 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  POSTOPERATIVE DIAGNOSES: 1. Hoarseness. 2. Left vocal cord nodule. 3. 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  INDICATIONS FOR SURGERY: 1. Hoarseness. 2. Left vocal cord nodule. 3. 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  SURGICAL PROCEDURE:  Microlaryngoscopy with left vocal cord stripping and excisional biopsy of vocal cord nodule.  SURGEON:  Early Chars. Wilburn Cornelia, M.D.  ANESTHESIA:  General endotracheal.  COMPLICATIONS:  None.  BLOOD LOSS:  Minimal.  DISPOSITION:  The patient was transferred from the operating room to the recovery room in stable condition.  BRIEF HISTORY:  The patient is a healthy 64 year old male who has been followed in our office with a history of hoarseness.  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  The patient was stable and doing well.  He noted a several week history of new onset hoarseness, no pain or difficulty swallowing.  Flexible laryngoscopy in the office revealed a small left vocal cord mass; no bleeding, ulceration, or other significant finding.  Given the patient's history, I recommended that we undertake excisional biopsy of the mass for pathologic diagnosis and improvement in vocal cord function.  The risks and benefits of  the procedure were discussed in detail with the patient and his wife, and they understood and concurred with our plan for surgery which is scheduled on elective basis at [place].  DESCRIPTION OF PROCEDURE:  The patient was brought to the operating room on October 16, 2014, placed in a supine position on the operating table. General endotracheal anesthesia was established without difficulty. When the patient was adequately anesthetized, he was positioned and prepped and draped.  A surgical time-out was then performed after the correct identification of the patient and the proposed surgical procedure.  With the patient prepped and draped, a Dedo laryngoscope was inserted without difficulty after the tooth guard was placed.  The patient's oral cavity and oropharynx were examined.  Supraglottis and hypopharynx were all normal.  Laryngoscope was then moved into position for direct visualization of the patient's larynx.  The suspension retractor was then placed, and the operating microscope was moved into position for better examination.  On the right-hand side, the vocal cord appeared normal.  The patient had undergone previous biopsy and radiation therapy, and there was no evidence of ulcer, mass, or lesion.  On the patient's left-hand side, there was a small several millimeter pedunculated mass on the vibratory surface in the anterior  1/3rd of the left vocal cord.  Using microscopic cup forceps, the mass was completely removed.  Vocal cord stripping was then undertaken, working from anterior to posterior, removing the mucosa underlying the mass which were stripped without difficulty.  There were no adhesions and no evidence of infiltration of the vocal cord itself.  The tissue was sent to Pathology for gross microscopic evaluation.  An adrenaline soaked cottonoid pledget was then placed over the left vocal cord for hemostasis.  There was no active bleeding.  No other  ulcer, mass, or lesion was noted.  The laryngoscope was carefully removed atraumatically.  There were no loose or broken teeth and no bleeding. The patient was then awakened from his anesthetic.  He was extubated and transferred from the operating room to recovery room in stable condition.          ______________________________ Early Chars Wilburn Cornelia, M.D.     DLS/MEDQ  D:  [id number]  T:  10/16/2014  Job:  [id number]

## 2014-10-16 NOTE — Transfer of Care (Signed)
Immediate Anesthesia Transfer of Care Note  Patient: Jack Robinson  Procedure(s) Performed: Procedure(s): MICROLARYNGOSCOPY WITH BIOPSY OF VOCAL MASS; LEFT (Left)  Patient Location: PACU  Anesthesia Type:General  Level of Consciousness: awake, alert  and oriented  Airway & Oxygen Therapy: Patient Spontanous Breathing and Patient connected to nasal cannula oxygen  Post-op Assessment: Report given to RN and Post -op Vital signs reviewed and stable  Post vital signs: Reviewed and stable  Last Vitals:  Filed Vitals:   10/16/14 0828  BP:   Pulse:   Temp: 36.6 C  Resp:     Complications: No apparent anesthesia complications

## 2014-10-16 NOTE — H&P (Signed)
Jack Robinson is an 64 y.o. male.   Chief Complaint: Hoarseness HPI: hx of Rt VC SCCa, s/p XRT. New onset hoarseness  Past Medical History  Diagnosis Date  . Hypertension   . 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 Chronic hoarseness     4 to 5 months  . 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 Hx of echocardiogram     a. Echo (11/15):  mild LVH, EF 60-65%, Gr 2 DD, trivial AI, mild BAE  . GERD (gastroesophageal reflux disease)     pt. denies this is a problem    Past Surgical History  Procedure Laterality Date  . Colonoscopy w/ polypectomy  01/2010    every 5 years  . Microlaryngoscopy  01/19/2012    Procedure: MICROLARYNGOSCOPY;  Surgeon: Jerrell Belfast, MD;  Location: [facility];  Service: ENT;  Laterality: Left;  MICROLARYNGOSCOPY WITH EXCISON OF VOCAL CORD POLYP  . Vocal cord biopsy  01/19/12    right polyp bx=/ invasive squamous cell ca    History reviewed. No pertinent family history. Social History:  reports that he has never smoked. He has never used smokeless tobacco. He reports that he does not drink alcohol or use illicit drugs.  Allergies: No Known Allergies  Medications Prior to Admission  Medication Sig Dispense Refill  . amLODipine (NORVASC) 10 MG tablet Take 10 mg by mouth daily.    Marland Kitchen aspirin EC 81 MG tablet Take 81 mg by mouth daily.    Marland Kitchen ezetimibe-simvastatin (VYTORIN) 10-20 MG per tablet Take 1 tablet by mouth daily.    Marland Kitchen lisinopril (PRINIVIL,ZESTRIL) 20 MG tablet Take 20 mg by mouth every evening.    Marland Kitchen lisinopril-hydrochlorothiazide (PRINZIDE,ZESTORETIC) 20-25 MG per tablet Take 1 tablet by mouth every morning.     . potassium chloride SA (K-DUR,KLOR-CON) 20 MEQ tablet Take 20 mEq by mouth daily.    . tamsulosin (FLOMAX) 0.4 MG CAPS capsule Take 0.4 mg by mouth every evening.      No results found for this or any previous visit (from the past 48 hour(s)). No results found.  Review of  Systems  Constitutional: Negative.   HENT: Negative.   Respiratory: Negative.   Cardiovascular: Negative.     Blood pressure 150/85, pulse 95, temperature 98.8 F (37.1 C), temperature source Oral, resp. rate 18, height 5\' 11"  (1.803 m), weight 87.544 kg (193 lb), SpO2 100 %. Physical Exam  Constitutional: He is oriented to person, place, and time. He appears well-developed and well-nourished.  HENT:  Left VC nodule  Neck: Normal range of motion. Neck supple.  Cardiovascular: Normal rate.   Respiratory: Effort normal.  GI: Soft.  Musculoskeletal: Normal range of motion.  Neurological: He is alert and oriented to person, place, and time.     Assessment/Plan Adm for OP BX  Triston Lisanti 10/16/2014, 7:20 AM

## 2014-10-16 NOTE — Brief Op Note (Signed)
10/16/2014  8:25 AM  PATIENT:  Jack Robinson  64 y.o. male  PRE-OPERATIVE DIAGNOSIS:  LEFT VOCAL MASS  POST-OPERATIVE DIAGNOSIS:  LEFT VOCAL MASS  PROCEDURE:  Procedure(s): MICROLARYNGOSCOPY WITH BIOPSY OF VOCAL MASS; LEFT (Left)  SURGEON:  Surgeon(s) and Role:    * Jerrell Belfast, MD - Primary  PHYSICIAN ASSISTANT:   ASSISTANTS: none   ANESTHESIA:   general  EBL:  Total I/O In: 500 [I.V.:500] Out: 10 [Blood:10]  BLOOD ADMINISTERED:none  DRAINS: none   LOCAL MEDICATIONS USED:  NONE  SPECIMEN:  Source of Specimen:  Left vocal cord mass  DISPOSITION OF SPECIMEN:  PATHOLOGY  COUNTS:  YES  TOURNIQUET:  * No tourniquets in log *  DICTATION: .Other Dictation: Dictation Number [id number]  PLAN OF CARE: Discharge to home after PACU  PATIENT DISPOSITION:  PACU - hemodynamically stable.   Delay start of Pharmacological VTE agent (>24hrs) due to surgical blood loss or risk of bleeding: not applicable

## 2014-10-16 NOTE — Anesthesia Preprocedure Evaluation (Signed)
Anesthesia Evaluation  Patient identified by MRN, date of birth, ID band Patient awake    Reviewed: Allergy & Precautions, NPO status , Patient's Chart, lab work & pertinent test results  Airway Mallampati: II  TM Distance: >3 FB Neck ROM: Full    Dental no notable dental hx.    Pulmonary neg pulmonary ROS,  breath sounds clear to auscultation  Pulmonary exam normal       Cardiovascular hypertension, Pt. on medications Normal cardiovascular examRhythm:Regular Rate:Normal     Neuro/Psych negative neurological ROS  negative psych ROS   GI/Hepatic negative GI ROS, Neg liver ROS, GERD-  ,  Endo/Other  negative endocrine ROS  Renal/GU negative Renal ROS  negative genitourinary   Musculoskeletal negative musculoskeletal ROS (+)   Abdominal   Peds negative pediatric ROS (+)  Hematology negative hematology ROS (+)   Anesthesia Other Findings   Reproductive/Obstetrics negative OB ROS                             Anesthesia Physical Anesthesia Plan  ASA: II  Anesthesia Plan: General   Post-op Pain Management:    Induction: Intravenous  Airway Management Planned: Video Laryngoscope Planned and Oral ETT  Additional Equipment:   Intra-op Plan:   Post-operative Plan: Extubation in OR  Informed Consent: I have reviewed the patients History and Physical, chart, labs and discussed the procedure including the risks, benefits and alternatives for the proposed anesthesia with the patient or authorized representative who has indicated his/her understanding and acceptance.   Dental advisory given  Plan Discussed with: CRNA and Surgeon  Anesthesia Plan Comments:         Anesthesia Quick Evaluation

## 2014-10-16 NOTE — Anesthesia Postprocedure Evaluation (Signed)
  Anesthesia Post-op Note  Patient: Jack Robinson  Procedure(s) Performed: Procedure(s) (LRB): MICROLARYNGOSCOPY WITH BIOPSY OF VOCAL MASS; LEFT (Left)  Patient Location: PACU  Anesthesia Type: General  Level of Consciousness: awake and alert   Airway and Oxygen Therapy: Patient Spontanous Breathing  Post-op Pain: mild  Post-op Assessment: Post-op Vital signs reviewed, Patient's Cardiovascular Status Stable, Respiratory Function Stable, Patent Airway and No signs of Nausea or vomiting  Last Vitals:  Filed Vitals:   10/16/14 0845  BP: 120/72  Pulse: 75  Temp:   Resp: 16    Post-op Vital Signs: stable   Complications: No apparent anesthesia complications

## 2014-10-16 NOTE — Anesthesia Procedure Notes (Signed)
Procedure Name: Intubation Date/Time: 10/16/2014 7:48 AM Performed by: Susa Loffler Pre-anesthesia Checklist: Patient identified, Timeout performed, Emergency Drugs available, Suction available and Patient being monitored Patient Re-evaluated:Patient Re-evaluated prior to inductionOxygen Delivery Method: Circle system utilized Preoxygenation: Pre-oxygenation with 100% oxygen Intubation Type: IV induction Ventilation: Mask ventilation without difficulty Laryngoscope Size: Mac and 3 (green glidescope blade) Grade View: Grade I Tube type: Oral Tube size: 6.5 mm Number of attempts: 1 Airway Equipment and Method: Stylet,  Video-laryngoscopy and LTA kit utilized Placement Confirmation: ETT inserted through vocal cords under direct vision,  positive ETCO2 and breath sounds checked- equal and bilateral Secured at: 23 cm Tube secured with: Tape Dental Injury: Teeth and Oropharynx as per pre-operative assessment

## 2014-10-17 ENCOUNTER — Encounter (HOSPITAL_COMMUNITY): Payer: Self-pay | Admitting: Otolaryngology

## 2014-12-20 ENCOUNTER — Other Ambulatory Visit: Payer: Self-pay | Admitting: Urology

## 2015-01-31 NOTE — Patient Instructions (Addendum)
19 Jack Robinson  01/31/2015   Your procedure is scheduled on:   02-08-2015 Thursday  Enter through [place] and follow signs to [facility]. Arrive at    0700   AM .  (Limit 1 person with you).  Call this number if you have problems the morning of surgery: [phone]  Or Presurgical Testing [phone].    Do not eat food/ or drink: After Midnight.    Take these medicines the morning of surgery with A SIP OF WATER: Amlodipine, Vytorin.  (DO NOT TAKE ANY DIABETIC MEDS AM OF SURGERY)    Do not wear jewelry, make-up or nail polish.  Do not wear deodorant, lotions, powders, or perfumes.   Do not shave legs and under arms- 48 hours(2 days) prior to first CHG shower.(Shaving face and neck okay.)  Do not bring valuables to the hospital.(Hospital is not responsible for lost valuables).  Contacts, dentures or removable bridgework, body piercing, hair pins may not be worn into surgery.  Leave suitcase in the car. After surgery it may be brought to your room.  For patients admitted to the hospital, checkout time is 11:00 AM the day of discharge.(Restricted visitors-Any Persons displaying flu-like symptoms or illness).      Practice deep breathing and leg exercises.  -------------------------------------------------------------------------------------     [facility] - Preparing for Surgery Before surgery, you can play an important role.  Because skin is not sterile, your skin needs to be as free of germs as possible.  You can reduce the number of germs on your skin by washing with CHG (chlorahexidine gluconate) soap before surgery.  CHG is an antiseptic cleaner which kills germs and bonds with the skin to continue killing germs even after washing. Please DO NOT use if you have an allergy to CHG or antibacterial soaps.  If your skin becomes reddened/irritated stop using the CHG and inform your nurse when you arrive at Short Stay. Do not shave (including legs and  underarms) for at least 48 hours prior to the first CHG shower.  You may shave your face/neck. Please follow these instructions carefully:  1.  Shower with CHG Soap the night before surgery and the  morning of Surgery.  2.  If you choose to wash your hair, wash your hair first as usual with your  normal  shampoo.  3.  After you shampoo, rinse your hair and body thoroughly to remove the  shampoo.                           4.  Use CHG as you would any other liquid soap.  You can apply chg directly  to the skin and wash                       Gently with a scrungie or clean washcloth.  5.  Apply the CHG Soap to your body ONLY FROM THE NECK DOWN.   Do not use on face/ open                           Wound or open sores. Avoid contact with eyes, ears mouth and genitals (private parts).                       Wash face,  Genitals (private parts) with your normal soap.  6.  Wash thoroughly, paying special attention to the area where your surgery  will be performed.  7.  Thoroughly rinse your body with warm water from the neck down.  8.  DO NOT shower/wash with your normal soap after using and rinsing off  the CHG Soap.                9.  Pat yourself dry with a clean towel.            10.  Wear clean pajamas.            11.  Place clean sheets on your bed the night of your first shower and do not  sleep with pets. Day of Surgery : Do not apply any lotions/deodorants the morning of surgery.  Please wear clean clothes to the hospital/surgery center.  FAILURE TO FOLLOW THESE INSTRUCTIONS MAY RESULT IN THE CANCELLATION OF YOUR SURGERY PATIENT SIGNATURE_________________________________  NURSE SIGNATURE__________________________________  ________________________________________________________________________

## 2015-02-02 ENCOUNTER — Encounter (HOSPITAL_COMMUNITY)
Admission: RE | Admit: 2015-02-02 | Discharge: 2015-02-02 | Disposition: A | Discharge status: Home / Self Care | Payer: 59 | Source: Ambulatory Visit | Attending: Urology | Admitting: Urology

## 2015-02-02 ENCOUNTER — Encounter (HOSPITAL_COMMUNITY): Payer: Self-pay

## 2015-02-02 DIAGNOSIS — Z01818 Encounter for other preprocedural examination: Secondary | ICD-10-CM | POA: Insufficient documentation

## 2015-02-02 DIAGNOSIS — N400 Enlarged prostate without lower urinary tract symptoms: Secondary | ICD-10-CM | POA: Insufficient documentation

## 2015-02-02 LAB — BASIC METABOLIC PANEL
Anion gap: 8 (ref 5–15)
BUN: 15 mg/dL (ref 6–20)
CHLORIDE: 104 mmol/L (ref 101–111)
CO2: 29 mmol/L (ref 22–32)
CREATININE: 0.98 mg/dL (ref 0.61–1.24)
Calcium: 9.7 mg/dL (ref 8.9–10.3)
GFR calc non Af Amer: >60 mL/min (ref >60)
Glucose, Bld: 102 mg/dL — ABNORMAL HIGH (ref 65–99)
POTASSIUM: 3.4 mmol/L — AB (ref 3.5–5.1)
SODIUM: 141 mmol/L (ref 135–145)

## 2015-02-02 LAB — CBC
HEMATOCRIT: 40.6 % (ref 39.0–52.0)
HEMOGLOBIN: 13.1 g/dL (ref 13.0–17.0)
MCH: 27.9 pg (ref 26.0–34.0)
MCHC: 32.3 g/dL (ref 30.0–36.0)
MCV: 86.4 fL (ref 78.0–100.0)
Platelets: 211 10*3/uL (ref 150–400)
RBC: 4.7 MIL/uL (ref 4.22–5.81)
RDW: 13 % (ref 11.5–15.5)
WBC: 5.4 10*3/uL (ref 4.0–10.5)

## 2015-02-02 NOTE — Pre-Procedure Instructions (Addendum)
Echo 11/15 epic EKG 02/10/14 epic - pt is scheduled for Op/Bed, and this EKG should last pt's hospital visit CXR 02/10/14 epic  Pt already received instruction on discontinuing Aspirin prior to surgery.  Instructed pt to follow Dr. Ralene Muskrat instructions.

## 2015-02-07 NOTE — H&P (Signed)
Active Problems Problems  1. Benign prostatic hyperplasia with urinary obstruction (N40.1,N13.8) 2. H/O bladder infections (Z87.440) 3. Incomplete bladder emptying (R33.9) 4. Microscopic hematuria (R31.2) 5. 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 Dicostanzo returns today in f/u for his history of an elevated PVR and reduced stream. He was given tamsulosin at his last visit for a PVR>1053ml. He is voiding better with the tamsulosin with a better stream. He voids about 2-3x during the day and has nocturia x 2. He has no dysuria or hematuria. He has no urgency or incontinence. His flowrate today was with 260ml voided but his PF was only 71ml/sec with a MF of 30ml/sec. His PVR remains >935ml.   Past Medical History Problems  1. History of Glycosuria (R81) 2. H/O bladder infections (Z87.440) 3. History of hypercholesterolemia (Z86.39) 4. History of hypertension (Z86.79) 5. 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 History of Throat Surgery  Current Meds 1. AmLODIPine Besylate 5 MG Oral Tablet;  Therapy: (Recorded:26Apr2010) to Recorded 2. Aspirin 81 MG Oral Tablet Chewable;  Therapy: (Recorded:02Dec2015) to Recorded 3. Lisinopril 20 MG Oral Tablet;  Therapy: (Recorded:26Apr2010) to Recorded 4. Lisinopril-Hydrochlorothiazide 20-25 MG Oral Tablet;  Therapy: (Recorded:26Apr2010) to Recorded 5. Potassium Chloride 20 MEQ TBCR;  Therapy: (Recorded:26Apr2010) to Recorded 6. Tamsulosin HCl - 0.4 MG Oral Capsule; TAKE 1 CAPSULE Daily;  Therapy: [id number] to (Evaluate:16Mar2017)  Requested for: 21Mar2016; Last  Rx:21Mar2016 Ordered 7. Vytorin 10-20 MG Oral Tablet;  Therapy: (Recorded:26Apr2010) to Recorded  Allergies Medication  1. No Known Drug Allergies  Family History Problems  1. Family history of Death In The Family Father : Father   age ?; heart attack 2. Family history of Family Health  Status Number Of Children   1 son  Social History Problems  1. Alcohol Use   beer occ. 2. Denied: History of Caffeine Use 3. Death in the family, father   Age 8(Heart Attack) 38. Marital History - Currently Married 5. Never a smoker 6. Number of children   1 Son 7. Occupation:   HR Manager 8. Denied: History of Tobacco Use  Past and social history reviewed.   Review of Systems  ENT: no nasal passage blockage.  Cardiovascular: no leg swelling.    Vitals Vital Signs [Data Includes: Last 1 Day]  Recorded: 22Jun2016 03:32PM  Blood Pressure: 167 / 80 Temperature: 98.2 F Heart Rate: 105  Results/Data Urine [Data Includes: Last 1 Day]   22Jun2016  COLOR YELLOW   APPEARANCE CLEAR   SPECIFIC GRAVITY 1.025   pH 6.0   GLUCOSE 250 mg/dL  BILIRUBIN NEG   KETONE NEG mg/dL  BLOOD NEG   PROTEIN NEG mg/dL  UROBILINOGEN 0.2 mg/dL  NITRITE NEG   LEUKOCYTE ESTERASE NEG    Flow Rate: Voided 243 ml. A peak flow rate of 8ml/s, mean flow rate of 62ml/s and reduced but sustained. flow curve . reduced but sustained.  PVR: Ultrasound PVR >999 ml.    Assessment Assessed  1. Benign prostatic hyperplasia with urinary obstruction (N40.1,N13.8) 2. Incomplete bladder emptying (R33.9)  He is voiding better but has a very slow stream and still have a PVR >924ml.   Plan Health Maintenance  1. UA With REFLEX; [Do Not Release]; Status:Resulted - Requires Verification;   Done:  [id number] 03:28PM Incomplete bladder emptying  2. URODYNAMICS; Status:Hold For - Appointment,Date of Service; Requested  for:22Jun2016;   I will have him remain on tamsulosin for now but  I am going to set him up for urodynamics to get a better idea of whether his problem is primarily obstruction or primarily a hypotonic bladder.   He will probably need a TURP and I reviewed the risks of bleeding, infection, persistent voiding difficulty, incontinence, strictures, ejaculatory and erectile dysfunction,  thrombotic events and anesthetic complications.  He asked about the laser but I am concerned that if he has a hypotonic bladder he would be at greater risk of prostatic adhesions post op with laser than TURP based on my prior experience.   I will discuss the UDS results with him by phone and decide how to proceed.   Discussion/Summary CC: Dr. Lavone Orn.   Procedure  Procedure: Urodynamics  Test indication: Incomplete Emptying.  The procedure's risks, benefits and infection risk were discussed with the patient.  Pre Uroflow & Catheterization  Procedure: Pre Uroflow Study. Equipment And Procedure:  The patient did not void no urge to void - states last void was approx. 3 hours ago.  A(n) 14 French red rubber catheter was inserted.  After catheter placement, measurements were obtained the volume collected was 1400 ml.  A urodynamic catheter was inserted.  The patient tolerated the procedure well.  Cystometry  Procedure: Cystometrogram.  The bladder was filled with room temperature water at a rate of 50-100 cc per minute.  After bladder filling, measurements obtained include the maximum cystometric capacity of 1124ml.  Maximum capacity produced a feeling of bladder fullness.  Bladder sensations were hyposensitive . The first sensation of filling occurred at 660ml. The normal desire to void occurred at 922ml. The strong desire to void occurred at pt never reached a really strong desire to voidml. The bladder was stable.   Injection of contrast was performed for the cystometrogram.  Leak Point Pressure  Procedure: Valsalva Leak Point Pressure.  The patient was placed in the sitting position.  A fluoroscopic method was used to determine leak point pressure.  The patient did not leak and the maximum abdominal pressure generated was 113 cm H2O.  Pressure - Flow Study  Procedure: Pressure-Flow Study.  Findings: voluntary contraction generated, the volume voided was 60 ml, the maximum flow  rate was 2 ml/s, the detrusor pressure at maximum flow was 111 cmH20, the maximum detrusor pressure was 112 cm H2O and the postvoid residual was approx. 1130 ml.  Electromyogram  Procedure: Electromyogram. Equipment And Procedure:  Electromyogram activity was measured by surface electrodes.   EMG activity was basically quiet during voiding.  Fluoroscopy and VCUG  Procedure: Fluoroscopy and VCUG.  During VCUG/Cystogram, the precontrast film showed no bony abnormality.  During bladder filling, the contour was trabeculated. Findings included no reflux.  Elevation of the bladder base was noted. There were no complications.  Post procedure patient given one tablet po. Ciprofloxacin 500 mg.  Nurse Impression: Very large hyposensitive detrusor. Mr. Spiers arrived with 1400 mls in his bladder with no real urge to void. During the study, he held a max capacity of approx. 1190 mls. He did not express his 1st sensation until reaching 656 mls. No instability was noted. Even though he had not yet reached a strong desire, I had him go ahead and start trying to void at 1050 mls as filling was continued. He was able to generate a voluntary contraction and void. His pressures were high, and his flow was obstructed. He was only able to void 60 mls, leaving an initial pvr of approx. 1130 mls. Trabeculation and elevation of the bladder  base were noted. No reflux was seen. After the study he stood and voided approx. 50 mls more into a urinal leaving a final pvr of approx. 1080 mls.  Procedure was supervised by Dr. Jeffie Pollock    Assessment  1. Benign prostatic hyperplasia with urinary obstruction (N40.1,N13.8)  2. Incomplete bladder emptying (R33.9)   I have reviewed the study and contacted Mr. Faucett to discuss the results.  I have recommended TURP and reviewed the risks of bleeding, infection, incontinence, persistent difficulty voiding, strictures, injury to adjacent structures, fluid overload, thrombotic events and  anesthetic risks.  I also reviewed the risks of ejaculatory dysfunction and erectile dysfunction.  We will get him scheduled at his convenience.

## 2015-02-08 ENCOUNTER — Observation Stay (HOSPITAL_COMMUNITY)
Admission: RE | Admit: 2015-02-08 | Discharge: 2015-02-09 | Disposition: A | Discharge status: Home / Self Care | Payer: 59 | Source: Ambulatory Visit | Attending: Urology | Admitting: Urology

## 2015-02-08 ENCOUNTER — Encounter (HOSPITAL_COMMUNITY): Payer: Self-pay | Admitting: *Deleted

## 2015-02-08 ENCOUNTER — Encounter (HOSPITAL_COMMUNITY)
Admission: RE | Disposition: A | Discharge status: Home / Self Care | Payer: Self-pay | Source: Ambulatory Visit | Attending: Urology

## 2015-02-08 ENCOUNTER — Ambulatory Visit (HOSPITAL_COMMUNITY): Payer: 59 | Admitting: Certified Registered Nurse Anesthetist

## 2015-02-08 DIAGNOSIS — R339 Retention of urine, unspecified: Secondary | ICD-10-CM | POA: Diagnosis present

## 2015-02-08 DIAGNOSIS — Z8521 Personal history of malignant neoplasm of larynx: Secondary | ICD-10-CM | POA: Insufficient documentation

## 2015-02-08 DIAGNOSIS — N312 Flaccid neuropathic bladder, not elsewhere classified: Secondary | ICD-10-CM | POA: Diagnosis not present

## 2015-02-08 DIAGNOSIS — R351 Nocturia: Secondary | ICD-10-CM | POA: Diagnosis not present

## 2015-02-08 DIAGNOSIS — N401 Enlarged prostate with lower urinary tract symptoms: Principal | ICD-10-CM | POA: Insufficient documentation

## 2015-02-08 DIAGNOSIS — N281 Cyst of kidney, acquired: Secondary | ICD-10-CM | POA: Diagnosis not present

## 2015-02-08 DIAGNOSIS — N138 Other obstructive and reflux uropathy: Secondary | ICD-10-CM | POA: Diagnosis not present

## 2015-02-08 DIAGNOSIS — Z7982 Long term (current) use of aspirin: Secondary | ICD-10-CM | POA: Insufficient documentation

## 2015-02-08 DIAGNOSIS — C61 Malignant neoplasm of prostate: Secondary | ICD-10-CM | POA: Insufficient documentation

## 2015-02-08 DIAGNOSIS — K219 Gastro-esophageal reflux disease without esophagitis: Secondary | ICD-10-CM | POA: Diagnosis not present

## 2015-02-08 DIAGNOSIS — Z79899 Other long term (current) drug therapy: Secondary | ICD-10-CM | POA: Diagnosis not present

## 2015-02-08 DIAGNOSIS — E7800 Pure hypercholesterolemia, unspecified: Secondary | ICD-10-CM | POA: Insufficient documentation

## 2015-02-08 DIAGNOSIS — R338 Other retention of urine: Secondary | ICD-10-CM | POA: Insufficient documentation

## 2015-02-08 DIAGNOSIS — R3914 Feeling of incomplete bladder emptying: Secondary | ICD-10-CM

## 2015-02-08 DIAGNOSIS — Z923 Personal history of irradiation: Secondary | ICD-10-CM | POA: Diagnosis not present

## 2015-02-08 DIAGNOSIS — I10 Essential (primary) hypertension: Secondary | ICD-10-CM | POA: Insufficient documentation

## 2015-02-08 HISTORY — PX: TRANSURETHRAL RESECTION OF PROSTATE: SHX73

## 2015-02-08 SURGERY — TURP (TRANSURETHRAL RESECTION OF PROSTATE)
Anesthesia: General

## 2015-02-08 MED ORDER — FENTANYL CITRATE (PF) 100 MCG/2ML IJ SOLN
INTRAMUSCULAR | Status: DC | PRN
  Administered 2015-02-08 (×2): 50 ug via INTRAVENOUS

## 2015-02-08 MED ORDER — MIDAZOLAM HCL 2 MG/2ML IJ SOLN
INTRAMUSCULAR | Status: AC
  Filled 2015-02-08: qty 4

## 2015-02-08 MED ORDER — PROMETHAZINE HCL 25 MG/ML IJ SOLN: 6.2500 mg | INTRAMUSCULAR | Status: DC | PRN

## 2015-02-08 MED ORDER — MAGNESIUM HYDROXIDE 400 MG/5ML PO SUSP: 30.0000 mL | Freq: Every day | ORAL | Status: DC | PRN

## 2015-02-08 MED ORDER — CIPROFLOXACIN IN D5W 400 MG/200ML IV SOLN
INTRAVENOUS | Status: AC
  Filled 2015-02-08: qty 200

## 2015-02-08 MED ORDER — DEXAMETHASONE SODIUM PHOSPHATE 10 MG/ML IJ SOLN
INTRAMUSCULAR | Status: AC
  Filled 2015-02-08: qty 1

## 2015-02-08 MED ORDER — CIPROFLOXACIN HCL 500 MG PO TABS
500.0000 mg | ORAL_TABLET | Freq: Two times a day (BID) | ORAL | Status: DC
  Administered 2015-02-08 – 2015-02-09 (×2): 500 mg via ORAL
  Filled 2015-02-08 (×2): qty 1

## 2015-02-08 MED ORDER — ATROPINE SULFATE 0.4 MG/ML IJ SOLN
INTRAMUSCULAR | Status: AC
  Filled 2015-02-08: qty 1

## 2015-02-08 MED ORDER — FENTANYL CITRATE (PF) 100 MCG/2ML IJ SOLN
INTRAMUSCULAR | Status: AC
  Filled 2015-02-08: qty 4

## 2015-02-08 MED ORDER — ONDANSETRON HCL 4 MG/2ML IJ SOLN: 4.0000 mg | INTRAMUSCULAR | Status: DC | PRN

## 2015-02-08 MED ORDER — ONDANSETRON HCL 4 MG/2ML IJ SOLN
INTRAMUSCULAR | Status: DC | PRN
  Administered 2015-02-08: 4 mg via INTRAVENOUS

## 2015-02-08 MED ORDER — FENTANYL CITRATE (PF) 100 MCG/2ML IJ SOLN
INTRAMUSCULAR | Status: AC
  Filled 2015-02-08: qty 2

## 2015-02-08 MED ORDER — HYDROCHLOROTHIAZIDE 25 MG PO TABS
25.0000 mg | ORAL_TABLET | Freq: Every day | ORAL | Status: DC
  Administered 2015-02-08 – 2015-02-09 (×2): 25 mg via ORAL
  Filled 2015-02-08 (×2): qty 1

## 2015-02-08 MED ORDER — HYDROCODONE-ACETAMINOPHEN 5-325 MG PO TABS: 1.0000 | ORAL_TABLET | Freq: Four times a day (QID) | ORAL | Status: DC | PRN

## 2015-02-08 MED ORDER — CIPROFLOXACIN IN D5W 400 MG/200ML IV SOLN
400.0000 mg | INTRAVENOUS | Status: AC
  Administered 2015-02-08: 400 mg via INTRAVENOUS

## 2015-02-08 MED ORDER — PNEUMOCOCCAL VAC POLYVALENT 25 MCG/0.5ML IJ INJ
0.5000 mL | INJECTION | INTRAMUSCULAR | Status: AC
  Administered 2015-02-09: 0.5 mL via INTRAMUSCULAR
  Filled 2015-02-08 (×2): qty 0.5

## 2015-02-08 MED ORDER — AMLODIPINE BESYLATE 10 MG PO TABS
10.0000 mg | ORAL_TABLET | Freq: Every day | ORAL | Status: DC
  Administered 2015-02-09: 10 mg via ORAL
  Filled 2015-02-08: qty 1

## 2015-02-08 MED ORDER — LIDOCAINE HCL (CARDIAC) 20 MG/ML IV SOLN
INTRAVENOUS | Status: DC | PRN
  Administered 2015-02-08: 100 mg via INTRAVENOUS

## 2015-02-08 MED ORDER — DOCUSATE SODIUM 100 MG PO CAPS
100.0000 mg | ORAL_CAPSULE | Freq: Two times a day (BID) | ORAL | Status: DC
  Administered 2015-02-08 – 2015-02-09 (×3): 100 mg via ORAL
  Filled 2015-02-08 (×3): qty 1

## 2015-02-08 MED ORDER — LISINOPRIL-HYDROCHLOROTHIAZIDE 20-25 MG PO TABS: 1.0000 | ORAL_TABLET | Freq: Every morning | ORAL | Status: DC

## 2015-02-08 MED ORDER — DEXAMETHASONE SODIUM PHOSPHATE 10 MG/ML IJ SOLN
INTRAMUSCULAR | Status: DC | PRN
  Administered 2015-02-08: 10 mg via INTRAVENOUS

## 2015-02-08 MED ORDER — SCOPOLAMINE 1 MG/3DAYS TD PT72
MEDICATED_PATCH | TRANSDERMAL | Status: AC
  Filled 2015-02-08: qty 1

## 2015-02-08 MED ORDER — EZETIMIBE-SIMVASTATIN 10-20 MG PO TABS
1.0000 | ORAL_TABLET | Freq: Every day | ORAL | Status: DC
  Administered 2015-02-09: 1 via ORAL
  Filled 2015-02-08 (×2): qty 1

## 2015-02-08 MED ORDER — EPHEDRINE SULFATE 50 MG/ML IJ SOLN
INTRAMUSCULAR | Status: AC
  Filled 2015-02-08: qty 1

## 2015-02-08 MED ORDER — HYDROMORPHONE HCL 1 MG/ML IJ SOLN: 0.5000 mg | INTRAMUSCULAR | Status: DC | PRN

## 2015-02-08 MED ORDER — SODIUM CHLORIDE 0.9 % IR SOLN
Status: DC | PRN
  Administered 2015-02-08: 15000 mL via INTRAVESICAL

## 2015-02-08 MED ORDER — FENTANYL CITRATE (PF) 100 MCG/2ML IJ SOLN
25.0000 ug | INTRAMUSCULAR | Status: DC | PRN
  Administered 2015-02-08 (×2): 50 ug via INTRAVENOUS

## 2015-02-08 MED ORDER — SODIUM CHLORIDE 0.9 % IR SOLN
3000.0000 mL | Status: DC
Start: 2015-02-08 — End: 2015-02-09
  Administered 2015-02-08: 3000 mL

## 2015-02-08 MED ORDER — MIDAZOLAM HCL 5 MG/5ML IJ SOLN
INTRAMUSCULAR | Status: DC | PRN
  Administered 2015-02-08: 2 mg via INTRAVENOUS

## 2015-02-08 MED ORDER — ONDANSETRON HCL 4 MG/2ML IJ SOLN
INTRAMUSCULAR | Status: AC
  Filled 2015-02-08: qty 2

## 2015-02-08 MED ORDER — PROPOFOL 10 MG/ML IV BOLUS
INTRAVENOUS | Status: DC | PRN
  Administered 2015-02-08: 180 mg via INTRAVENOUS

## 2015-02-08 MED ORDER — BISACODYL 10 MG RE SUPP: 10.0000 mg | Freq: Every day | RECTAL | Status: DC | PRN

## 2015-02-08 MED ORDER — FLEET ENEMA 7-19 GM/118ML RE ENEM: 1.0000 | ENEMA | Freq: Once | RECTAL | Status: DC | PRN

## 2015-02-08 MED ORDER — EPHEDRINE SULFATE 50 MG/ML IJ SOLN
INTRAMUSCULAR | Status: DC | PRN
  Administered 2015-02-08: 10 mg via INTRAVENOUS
  Administered 2015-02-08: 5 mg via INTRAVENOUS

## 2015-02-08 MED ORDER — KCL IN DEXTROSE-NACL 20-5-0.45 MEQ/L-%-% IV SOLN
INTRAVENOUS | Status: DC
  Administered 2015-02-08 – 2015-02-09 (×3): via INTRAVENOUS
  Filled 2015-02-08 (×5): qty 1000

## 2015-02-08 MED ORDER — PHENYLEPHRINE 40 MCG/ML (10ML) SYRINGE FOR IV PUSH (FOR BLOOD PRESSURE SUPPORT)
PREFILLED_SYRINGE | INTRAVENOUS | Status: AC
  Filled 2015-02-08: qty 20

## 2015-02-08 MED ORDER — LACTATED RINGERS IV SOLN
INTRAVENOUS | Status: DC
  Administered 2015-02-08: 1000 mL via INTRAVENOUS
  Administered 2015-02-08: 10:00:00 via INTRAVENOUS

## 2015-02-08 MED ORDER — PROPOFOL 10 MG/ML IV BOLUS
INTRAVENOUS | Status: AC
  Filled 2015-02-08: qty 20

## 2015-02-08 MED ORDER — DOCUSATE SODIUM 100 MG PO CAPS: 100.0000 mg | ORAL_CAPSULE | Freq: Two times a day (BID) | ORAL | Status: DC

## 2015-02-08 MED ORDER — HYOSCYAMINE SULFATE 0.125 MG SL SUBL
0.1250 mg | SUBLINGUAL_TABLET | SUBLINGUAL | Status: DC | PRN
  Filled 2015-02-08: qty 1

## 2015-02-08 MED ORDER — ACETAMINOPHEN 325 MG PO TABS: 650.0000 mg | ORAL_TABLET | ORAL | Status: DC | PRN

## 2015-02-08 MED ORDER — PHENYLEPHRINE 40 MCG/ML (10ML) SYRINGE FOR IV PUSH (FOR BLOOD PRESSURE SUPPORT)
PREFILLED_SYRINGE | INTRAVENOUS | Status: AC
  Filled 2015-02-08: qty 10

## 2015-02-08 MED ORDER — SCOPOLAMINE 1 MG/3DAYS TD PT72
1.0000 | MEDICATED_PATCH | Freq: Once | TRANSDERMAL | Status: DC
  Administered 2015-02-08: 1.5 mg via TRANSDERMAL
  Filled 2015-02-08: qty 1

## 2015-02-08 MED ORDER — DIPHENHYDRAMINE HCL 12.5 MG/5ML PO ELIX: 12.5000 mg | ORAL_SOLUTION | Freq: Four times a day (QID) | ORAL | Status: DC | PRN

## 2015-02-08 MED ORDER — PHENYLEPHRINE HCL 10 MG/ML IJ SOLN
INTRAMUSCULAR | Status: DC | PRN
  Administered 2015-02-08 (×6): 80 ug via INTRAVENOUS

## 2015-02-08 MED ORDER — LIDOCAINE HCL (CARDIAC) 20 MG/ML IV SOLN
INTRAVENOUS | Status: AC
  Filled 2015-02-08: qty 5

## 2015-02-08 MED ORDER — SODIUM CHLORIDE 0.9 % IJ SOLN
INTRAMUSCULAR | Status: AC
  Filled 2015-02-08: qty 10

## 2015-02-08 MED ORDER — LISINOPRIL 20 MG PO TABS
20.0000 mg | ORAL_TABLET | Freq: Two times a day (BID) | ORAL | Status: DC
  Administered 2015-02-08 – 2015-02-09 (×2): 20 mg via ORAL
  Filled 2015-02-08 (×2): qty 1

## 2015-02-08 MED ORDER — HYDROCODONE-ACETAMINOPHEN 5-325 MG PO TABS: 1.0000 | ORAL_TABLET | ORAL | Status: DC | PRN

## 2015-02-08 MED ORDER — POTASSIUM CHLORIDE CRYS ER 20 MEQ PO TBCR
20.0000 meq | EXTENDED_RELEASE_TABLET | Freq: Every day | ORAL | Status: DC
  Administered 2015-02-08 – 2015-02-09 (×2): 20 meq via ORAL
  Filled 2015-02-08 (×2): qty 1

## 2015-02-08 MED ORDER — ZOLPIDEM TARTRATE 5 MG PO TABS: 5.0000 mg | ORAL_TABLET | Freq: Every evening | ORAL | Status: DC | PRN

## 2015-02-08 MED ORDER — DIPHENHYDRAMINE HCL 50 MG/ML IJ SOLN: 12.5000 mg | Freq: Four times a day (QID) | INTRAMUSCULAR | Status: DC | PRN

## 2015-02-08 SURGICAL SUPPLY — 16 items
BAG URINE DRAINAGE (UROLOGICAL SUPPLIES) IMPLANT
BAG URO CATCHER STRL LF (DRAPE) ×3 IMPLANT
CATH FOLEY 3WAY 30CC 22FR (CATHETERS) ×3 IMPLANT
CATH URET 5FR 28IN OPEN ENDED (CATHETERS) IMPLANT
ELECT REM PT RETURN 9FT ADLT (ELECTROSURGICAL) ×3
ELECTRODE REM PT RTRN 9FT ADLT (ELECTROSURGICAL) ×1 IMPLANT
GLOVE SURG SS PI 8.0 STRL IVOR (GLOVE) IMPLANT
GOWN STRL REUS W/TWL XL LVL3 (GOWN DISPOSABLE) ×3 IMPLANT
HOLDER FOLEY CATH W/STRAP (MISCELLANEOUS) ×3 IMPLANT
LOOP CUT BIPOLAR 24F LRG (ELECTROSURGICAL) IMPLANT
MANIFOLD NEPTUNE II (INSTRUMENTS) ×3 IMPLANT
PACK CYSTO (CUSTOM PROCEDURE TRAY) ×3 IMPLANT
SYR 30ML LL (SYRINGE) ×3 IMPLANT
SYRINGE IRR TOOMEY STRL 70CC (SYRINGE) ×3 IMPLANT
TUBING CONNECTING 10 (TUBING) ×2 IMPLANT
TUBING CONNECTING 10' (TUBING) ×1

## 2015-02-08 NOTE — Anesthesia Procedure Notes (Signed)
Procedure Name: LMA Insertion Date/Time: 02/08/2015 9:05 AM Performed by: Montel Clock Pre-anesthesia Checklist: Patient identified, Emergency Drugs available, Suction available, Patient being monitored and Timeout performed Patient Re-evaluated:Patient Re-evaluated prior to inductionOxygen Delivery Method: Circle system utilized Preoxygenation: Pre-oxygenation with 100% oxygen Intubation Type: IV induction Ventilation: Mask ventilation without difficulty and Oral airway inserted - appropriate to patient size LMA: LMA with gastric port inserted LMA Size: 4.0 Number of attempts: 1 Dental Injury: Teeth and Oropharynx as per pre-operative assessment  Comments: Slight leak with LMA proseal #4, improved with repositioning head/adjusting pressure support.

## 2015-02-08 NOTE — Brief Op Note (Signed)
02/08/2015  10:04 AM  PATIENT:  Jack Robinson  64 y.o. male  PRE-OPERATIVE DIAGNOSIS:  BPH WITH BLADDER OUTLET OBSTRUCTION  POST-OPERATIVE DIAGNOSIS:  BPH WITH BLADDER OUTLET OBSTRUCTION  PROCEDURE:  Procedure(s): TRANSURETHRAL RESECTION OF THE PROSTATE (TURP) (N/A)  SURGEON:  Surgeon(s) and Role:    * Irine Seal, MD - Primary  PHYSICIAN ASSISTANT:   ASSISTANTS: none   ANESTHESIA:   general  EBL:     BLOOD ADMINISTERED:none  DRAINS: Urinary Catheter (Foley)   LOCAL MEDICATIONS USED:  NONE  SPECIMEN:  Source of Specimen:  prostate chips  DISPOSITION OF SPECIMEN:  PATHOLOGY  COUNTS:  YES  TOURNIQUET:  * No tourniquets in log *  DICTATION: .Other Dictation: Dictation Number [id number]  PLAN OF CARE: Admit for overnight observation  PATIENT DISPOSITION:  PACU - hemodynamically stable.   Delay start of Pharmacological VTE agent (>24hrs) due to surgical blood loss or risk of bleeding: yes

## 2015-02-08 NOTE — Anesthesia Postprocedure Evaluation (Signed)
  Anesthesia Post-op Note  Patient: Jack Robinson  Procedure(s) Performed: Procedure(s) (LRB): TRANSURETHRAL RESECTION OF THE PROSTATE (TURP) (N/A)  Patient Location: PACU  Anesthesia Type: General  Level of Consciousness: awake and alert   Airway and Oxygen Therapy: Patient Spontanous Breathing  Post-op Pain: mild  Post-op Assessment: Post-op Vital signs reviewed, Patient's Cardiovascular Status Stable, Respiratory Function Stable, Patent Airway and No signs of Nausea or vomiting  Last Vitals:  Filed Vitals:   02/08/15 1115  BP: 126/71  Pulse: 69  Temp: 36.3 C  Resp: 14    Post-op Vital Signs: stable   Complications: No apparent anesthesia complications

## 2015-02-08 NOTE — Anesthesia Preprocedure Evaluation (Addendum)
Anesthesia Evaluation  Patient identified by MRN, date of birth, ID band Patient awake    Reviewed: Allergy & Precautions, NPO status , Patient's Chart, lab work & pertinent test results  History of Anesthesia Complications (+) DIFFICULT AIRWAY and history of anesthetic complications  Airway Mallampati: III  TM Distance: >3 FB Neck ROM: Full    Dental  (+) Teeth Intact, Dental Advisory Given   Pulmonary neg pulmonary ROS,    Pulmonary exam normal breath sounds clear to auscultation       Cardiovascular Exercise Tolerance: Good hypertension, Pt. on medications (-) angina(-) Past MI Normal cardiovascular exam Rhythm:Regular Rate:Normal     Neuro/Psych negative neurological ROS  negative psych ROS   GI/Hepatic Neg liver ROS, GERD  ,  Endo/Other  negative endocrine ROS  Renal/GU negative Renal ROS     Musculoskeletal negative musculoskeletal ROS (+)   Abdominal   Peds  Hematology negative hematology ROS (+)   Anesthesia Other Findings Day of surgery medications reviewed with the patient.  Malignant neoplasm of glottis s/p radiation, resection   Reproductive/Obstetrics                           Anesthesia Physical Anesthesia Plan  ASA: II  Anesthesia Plan: General   Post-op Pain Management:    Induction: Intravenous  Airway Management Planned: LMA  Additional Equipment:   Intra-op Plan:   Post-operative Plan: Extubation in OR  Informed Consent: I have reviewed the patients History and Physical, chart, labs and discussed the procedure including the risks, benefits and alternatives for the proposed anesthesia with the patient or authorized representative who has indicated his/her understanding and acceptance.   Dental advisory given  Plan Discussed with: CRNA  Anesthesia Plan Comments: (Risks/benefits of general anesthesia discussed with patient including risk of damage to  teeth, lips, gum, and tongue, nausea/vomiting, allergic reactions to medications, and the possibility of heart attack, stroke and death.  All patient questions answered.  Patient wishes to proceed.)        Anesthesia Quick Evaluation

## 2015-02-08 NOTE — Op Note (Signed)
Jack Robinson, Jack Robinson               ACCOUNT NO.:  [id number]  MEDICAL RECORD NO.:  [id number]  LOCATION:  [id number]                         FACILITY:  [facility]  PHYSICIAN:  Marshall Cork. Jeffie Pollock, M.D.    DATE OF BIRTH:  09-08-1950  DATE OF PROCEDURE:  02/08/2015 DATE OF DISCHARGE:                              OPERATIVE REPORT   PROCEDURE:  Transurethral resection of the prostate.  PREOPERATIVE DIAGNOSES:  Benign prostatic hypertrophy, bladder outlet obstruction, hypotonic bladder with incomplete emptying.  POSTOPERATIVE DIAGNOSES:  Benign prostatic hypertrophy, bladder outlet obstruction, hypotonic bladder with incomplete emptying.  SURGEON:  Marshall Cork. Jeffie Pollock, M.D.  ANESTHESIA:  General.  SPECIMENS:  Prostate chips.  DRAINS:  A 22-French, 3-way Foley catheter.  BLOOD LOSS:  Minimal.  COMPLICATIONS:  None.  INDICATIONS:  Jack Robinson is a 64 year old African American male with BPH with bladder outlet obstruction and incomplete emptying with very large hyposensitive hypotonic bladder.  FINDINGS AND PROCEDURE:  He was given Cipro.  He was taken to the operating room where general anesthetic was induced.  He was placed in lithotomy position.  His perineum and genitalia was prepped with Betadine solution.  He was draped in usual sterile fashion.  A 28-French continuous flow resectoscope sheath was placed with the visual obturator.  Inspection revealed a normal urethra.  The external sphincter was intact.  The prostatic urethra was approximately 3 cm in length with trilobar hyperplasia with obstruction.  Examination of the bladder revealed a very large capacity bladder with mild trabeculation. No tumors, stones, or inflammation were noted.  Ureteral orifices were in the normal anatomic position.  The scope was then fitted with an Beatrix Fetters handle with a bipolar loop and 30-degree lens with saline as the irrigant and resection was initiated at the bladder neck with removal of the middle lobe.   Once the middle lobe had been resected, it actually extended laterally on the left up to the anterior prostate, I resected the floor out to alongside the verumontanum.  The right lobe of the prostate was then resected from bladder neck to apex followed by the left lobe.  Once the bulk of the resection was performed, the chips were removed and the bladder was drained. Inspection with the empty bladder demonstrated residual apical and anterior tissue that was then resected as well as some residual tissue in the left bladder neck.  Once these areas had been resected, hemostasis was achieved and the chips removed.  The patient was noted to have a very wide TUR defect. No retained chips in the bladder.  No active bleeding.  Ureteral orifices were intact.  The scope was then removed.  Pressure on the bladder produced a good stream.  A 22-French 3-way Foley catheter was inserted with the aid of a catheter guide and the balloon was filled with 30 mL of sterile fluid.  The guidewire was removed.  The catheter was irrigated with clear return and then placed on continuous irrigation with normal saline and straight drainage.  At this point, the patient was taken down from lithotomy position.  His anesthetic was reversed.  He was moved to recovery room in a stable condition.  The prostate chips had been  retrieved and sent for Pathology.     Marshall Cork. Jeffie Pollock, M.D.     JJW/MEDQ  D:  02/08/2015  T:  02/08/2015  Job:  [id number]

## 2015-02-08 NOTE — Interval H&P Note (Signed)
History and Physical Interval Note:  02/08/2015 8:51 AM  Jack Robinson  has presented today for surgery, with the diagnosis of BPH WITH BLADDER OUTLET OBSTRUCTION  The various methods of treatment have been discussed with the patient and family. After consideration of risks, benefits and other options for treatment, the patient has consented to  Procedure(s): TRANSURETHRAL RESECTION OF THE PROSTATE (TURP) (N/A) as a surgical intervention .  The patient's history has been reviewed, patient examined, no change in status, stable for surgery.  I have reviewed the patient's chart and labs.  Questions were answered to the patient's satisfaction.     Yittel Emrich J

## 2015-02-08 NOTE — Discharge Instructions (Signed)
Transurethral Resection of the Prostate, Care After °Refer to this sheet in the next few weeks. These instructions provide you with information on caring for yourself after your procedure. Your caregiver also may give you specific instructions. Your treatment has been planned according to current medical practices, but complications sometimes occur. Call your caregiver if you have any problems or questions after your procedure. °HOME CARE INSTRUCTIONS  °Recovery can take 4-6 weeks. Avoid alcohol, caffeinated drinks, and spicy foods for 2 weeks after your procedure. Drink enough fluids to keep your urine clear or pale yellow. Urinate as soon as you feel the urge to do so. Do not try to hold your urine for long periods of time. °During recovery you may experience pain caused by bladder spasms, which result in a very intense urge to urinate. Take all medicines as directed by your caregiver, including medicines for pain. Try to limit the amount of pain medicines you take because it can cause constipation. If you do become constipated, do not strain to move your bowels. Straining can increase bleeding. Constipation can be minimized by increasing the amount fluids and fiber in your diet. Your caregiver also may prescribe a stool softener. °Do not lift heavy objects (more than 5 lb [2.25 kg]) or perform exercises that cause you to strain for at least 1 month after your procedure. When sitting, you may want to sit in a soft chair or use a cushion. For the first 10 days after your procedure, avoid the following activities: °· Running. °· Strenuous work. °· Long walks. °· Riding in a car for extended periods. °· Sex. °SEEK MEDICAL CARE IF: °· You have difficulty urinating. °· You have blood in your urine that does not go away after you rest or increase your fluid intake. °· You have swelling in your penis or scrotum. °SEEK IMMEDIATE MEDICAL CARE IF:  °· You are suddenly unable to urinate. °· You notice blood clots in your  urine. °· You have chills. °· You have a fever. °· You have pain in your back or lower abdomen. °· You have pain or swelling in your legs. °MAKE SURE YOU:  °· Understand these instructions. °· Will watch your condition. °· Will get help right away if you are not doing well or get worse. °  °This information is not intended to replace advice given to you by your health care provider. Make sure you discuss any questions you have with your health care provider. °  °Document Released: 03/17/2005 Document Revised: 04/07/2014 Document Reviewed: 04/25/2011 °Elsevier Interactive Patient Education ©2016 [employer] ° °

## 2015-02-08 NOTE — Transfer of Care (Signed)
Immediate Anesthesia Transfer of Care Note  Patient: Jack Robinson  Procedure(s) Performed: Procedure(s): TRANSURETHRAL RESECTION OF THE PROSTATE (TURP) (N/A)  Patient Location: PACU  Anesthesia Type:General  Level of Consciousness:  sedated, patient cooperative and responds to stimulation  Airway & Oxygen Therapy:Patient Spontanous Breathing and Patient connected to face mask oxgen  Post-op Assessment:  Report given to PACU RN and Post -op Vital signs reviewed and stable  Post vital signs:  Reviewed and stable  Last Vitals:  Filed Vitals:   02/08/15 0711  BP: 143/90  Pulse: 88  Temp: 36.7 C  Resp: 17    Complications: No apparent anesthesia complications

## 2015-02-09 DIAGNOSIS — N401 Enlarged prostate with lower urinary tract symptoms: Secondary | ICD-10-CM | POA: Diagnosis not present

## 2015-02-09 MED ORDER — PHENOL 1.4 % MT LIQD
1.0000 | OROMUCOSAL | Status: DC | PRN
  Filled 2015-02-09 (×2): qty 177

## 2015-02-09 MED ORDER — MENTHOL 3 MG MT LOZG
1.0000 | LOZENGE | OROMUCOSAL | Status: DC | PRN
  Administered 2015-02-09: 3 mg via ORAL
  Filled 2015-02-09: qty 9

## 2015-02-09 NOTE — Discharge Summary (Signed)
Physician Discharge Summary  Patient ID: Jack Robinson MRN: [id number] DOB/AGE: 12-27-1950 64 y.o.  Admit date: 02/08/2015 Discharge date: 02/09/2015  Admission Diagnoses:  Benign prostatic hypertrophy (BPH) with incomplete bladder emptying  Discharge Diagnoses:  Principal Problem:   Benign prostatic hypertrophy (BPH) with incomplete bladder emptying   Past Medical History  Diagnosis Date  . Hypertension   . 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 Chronic hoarseness     4 to 5 months  . 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 Hx of echocardiogram     a. Echo (11/15):  mild LVH, EF 60-65%, Gr 2 DD, trivial AI, mild BAE  . GERD (gastroesophageal reflux disease)     pt. denies this is a problem    Surgeries: Procedure(s): TRANSURETHRAL RESECTION OF THE PROSTATE (TURP) on 02/08/2015   Consultants (if any):    Discharged Condition: Improved  Hospital Course: Jack Robinson is an 64 y.o. male who was admitted 02/08/2015 with a diagnosis of Benign prostatic hypertrophy (BPH) with incomplete bladder emptying and went to the operating room on 02/08/2015 and underwent the above named procedures.  His foley was removed this morning.    He has voiding 544ml this afternoon in a single void but his PVR is >1064ml.  This is not unusual for him and he was felt to be safe for discharge but was instructed to double void.   He was given perioperative antibiotics:      Anti-infectives    Start     Dose/Rate Route Frequency Ordered Stop   02/08/15 2000  ciprofloxacin (CIPRO) tablet 500 mg     500 mg Oral 2 times daily 02/08/15 1122     02/08/15 0708  ciprofloxacin (CIPRO) IVPB 400 mg     400 mg 200 mL/hr over 60 Minutes Intravenous 60 min pre-op 02/08/15 0708 02/08/15 0957    .  He was given sequential compression devices and early ambulation for DVT prophylaxis.  He benefited maximally from the hospital stay  and there were no complications.    Recent vital signs:  Filed Vitals:   02/09/15 0523  BP: 129/66  Pulse: 80  Temp: 98.4 F (36.9 C)  Resp: 16    Recent laboratory studies:  Lab Results  Component Value Date   HGB 13.1 02/02/2015   HGB 13.6 10/13/2014   HGB 11.0* 02/11/2014   Lab Results  Component Value Date   WBC 5.4 02/02/2015   PLT 211 02/02/2015   Lab Results  Component Value Date   INR 1.29 02/11/2014   Lab Results  Component Value Date   NA 141 02/02/2015   K 3.4* 02/02/2015   CL 104 02/02/2015   CO2 29 02/02/2015   BUN 15 02/02/2015   CREATININE 0.98 02/02/2015   GLUCOSE 102* 02/02/2015    Discharge Medications:     Medication List    TAKE these medications        amLODipine 10 MG tablet  Commonly known as:  NORVASC  Take 10 mg by mouth daily.     aspirin EC 81 MG tablet  Take 81 mg by mouth daily.     docusate sodium 100 MG capsule  Commonly known as:  COLACE  Take 1 capsule (100 mg total) by mouth 2 (two) times daily.     ezetimibe-simvastatin 10-20 MG tablet  Commonly known as:  VYTORIN  Take 1 tablet by mouth daily.  HYDROcodone-acetaminophen 5-325 MG tablet  Commonly known as:  NORCO  Take 1 tablet by mouth every 6 (six) hours as needed for moderate pain.     lisinopril 20 MG tablet  Commonly known as:  PRINIVIL,ZESTRIL  Take 20 mg by mouth every evening.     lisinopril-hydrochlorothiazide 20-25 MG tablet  Commonly known as:  PRINZIDE,ZESTORETIC  Take 1 tablet by mouth every morning.     potassium chloride SA 20 MEQ tablet  Commonly known as:  K-DUR,KLOR-CON  Take 20 mEq by mouth daily.        Diagnostic Studies: No results found.  Disposition: 01-Home or Self Care    Follow-up Information    Follow up with Karen Kays, NP On 02/27/2015.   Specialty:  Nurse Practitioner   Why:  1   Contact information:   [address] [place] [address] [phone]        Signed: Malka So 02/09/2015, 1:19 PM

## 2015-03-28 ENCOUNTER — Other Ambulatory Visit: Payer: Self-pay | Admitting: Gastroenterology

## 2015-05-09 ENCOUNTER — Encounter (HOSPITAL_COMMUNITY): Payer: Self-pay | Admitting: *Deleted

## 2015-05-21 ENCOUNTER — Ambulatory Visit (HOSPITAL_COMMUNITY)
Admission: RE | Admit: 2015-05-21 | Discharge: 2015-05-21 | Disposition: A | Discharge status: Home / Self Care | Payer: 59 | Source: Ambulatory Visit | Attending: Gastroenterology | Admitting: Gastroenterology

## 2015-05-21 ENCOUNTER — Ambulatory Visit (HOSPITAL_COMMUNITY): Payer: 59 | Admitting: Certified Registered"

## 2015-05-21 ENCOUNTER — Encounter (HOSPITAL_COMMUNITY): Payer: Self-pay

## 2015-05-21 ENCOUNTER — Encounter (HOSPITAL_COMMUNITY)
Admission: RE | Disposition: A | Discharge status: Home / Self Care | Payer: Self-pay | Source: Ambulatory Visit | Attending: Gastroenterology

## 2015-05-21 DIAGNOSIS — I10 Essential (primary) hypertension: Secondary | ICD-10-CM | POA: Insufficient documentation

## 2015-05-21 DIAGNOSIS — E7800 Pure hypercholesterolemia, unspecified: Secondary | ICD-10-CM | POA: Diagnosis not present

## 2015-05-21 DIAGNOSIS — Z8521 Personal history of malignant neoplasm of larynx: Secondary | ICD-10-CM | POA: Insufficient documentation

## 2015-05-21 DIAGNOSIS — K219 Gastro-esophageal reflux disease without esophagitis: Secondary | ICD-10-CM | POA: Diagnosis not present

## 2015-05-21 DIAGNOSIS — Z1211 Encounter for screening for malignant neoplasm of colon: Secondary | ICD-10-CM | POA: Diagnosis present

## 2015-05-21 DIAGNOSIS — Z86010 Personal history of colonic polyps: Secondary | ICD-10-CM | POA: Diagnosis not present

## 2015-05-21 HISTORY — PX: COLONOSCOPY WITH PROPOFOL: SHX5780

## 2015-05-21 SURGERY — COLONOSCOPY WITH PROPOFOL
Anesthesia: Monitor Anesthesia Care

## 2015-05-21 MED ORDER — LIDOCAINE HCL (CARDIAC) 20 MG/ML IV SOLN
INTRAVENOUS | Status: AC
  Filled 2015-05-21: qty 5

## 2015-05-21 MED ORDER — LIDOCAINE HCL (CARDIAC) 20 MG/ML IV SOLN
INTRAVENOUS | Status: DC | PRN
  Administered 2015-05-21: 80 mg via INTRAVENOUS

## 2015-05-21 MED ORDER — PROPOFOL 10 MG/ML IV BOLUS
INTRAVENOUS | Status: DC | PRN
  Administered 2015-05-21: 60 mg via INTRAVENOUS
  Administered 2015-05-21: 20 mg via INTRAVENOUS

## 2015-05-21 MED ORDER — PHENYLEPHRINE 40 MCG/ML (10ML) SYRINGE FOR IV PUSH (FOR BLOOD PRESSURE SUPPORT)
PREFILLED_SYRINGE | INTRAVENOUS | Status: AC
  Filled 2015-05-21: qty 10

## 2015-05-21 MED ORDER — PROPOFOL 10 MG/ML IV BOLUS
INTRAVENOUS | Status: AC
  Filled 2015-05-21: qty 40

## 2015-05-21 MED ORDER — PHENYLEPHRINE HCL 10 MG/ML IJ SOLN
INTRAMUSCULAR | Status: DC | PRN
  Administered 2015-05-21 (×3): 80 ug via INTRAVENOUS

## 2015-05-21 MED ORDER — PROPOFOL 500 MG/50ML IV EMUL
INTRAVENOUS | Status: DC | PRN
  Administered 2015-05-21: 200 ug/kg/min via INTRAVENOUS

## 2015-05-21 MED ORDER — SODIUM CHLORIDE 0.9 % IV SOLN: INTRAVENOUS | Status: DC

## 2015-05-21 MED ORDER — LACTATED RINGERS IV SOLN
INTRAVENOUS | Status: DC | PRN
  Administered 2015-05-21: 12:00:00 via INTRAVENOUS

## 2015-05-21 SURGICAL SUPPLY — 21 items

## 2015-05-21 NOTE — H&P (Signed)
  Procedure: Surveillance colonoscopy. Adenomatous colon polyps removed colonoscopically in the past  History: The patient is a 65 year old male born [birth year]. He is scheduled to undergo a surveillance colonoscopy today.  Past medical history: Hypertension. Hypercholesterolemia. 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  Medication allergies: Lipitor  Exam: The patient is alert and lying comfortably on the endoscopy stretcher. Abdomen is soft and nontender to palpation. Lungs are clear to auscultation. Cardiac exam reveals a regular rhythm.  Plan: Proceed with surveillance colonoscopy

## 2015-05-21 NOTE — Discharge Instructions (Signed)

## 2015-05-21 NOTE — Op Note (Signed)
Procedure: Surveillance colonoscopy. 02/07/2010 surveillance colonoscopy was performed with removal of two diminutive adenomatous sigmoid colon polyps  Endoscopist: Earle Gell  Premedication: Propofol administered by anesthesia  Procedure: The patient was placed in the left lateral decubitus position. Anal inspection and digital rectal exam were normal. The Pentax pediatric colonoscope was introduced into the rectum and advanced to the cecum. A normal-appearing ileocecal valve and appendiceal orifice were identified. Colonic preparation for the exam today was good. Withdrawal time was 10 minutes  Rectum. Normal. Retroflexed view of the distal rectum was normal  Sigmoid colon and descending colon. Normal  Splenic flexure. Normal  Transverse colon. Normal  Hepatic flexure. Normal  Ascending colon. Normal  Cecum and ileocecal valve. Normal  Assessment: Normal surveillance colonoscopy  Recommendation: Schedule repeat surveillance colonoscopy in approximately 5 years

## 2015-05-21 NOTE — Transfer of Care (Signed)
Immediate Anesthesia Transfer of Care Note  Patient: Jack Robinson  Procedure(s) Performed: Procedure(s): COLONOSCOPY WITH PROPOFOL (N/A)  Patient Location: PACU  Anesthesia Type:MAC  Level of Consciousness:  sedated, patient cooperative and responds to stimulation  Airway & Oxygen Therapy:Patient Spontanous Breathing and Patient connected to face mask oxgen  Post-op Assessment:  Report given to PACU RN and Post -op Vital signs reviewed and stable  Post vital signs:  Reviewed and stable  Last Vitals:  Filed Vitals:   05/21/15 1032  BP: 156/94  Pulse: 92  Temp: 36.2 C  Resp: 25    Complications: No apparent anesthesia complications

## 2015-05-21 NOTE — Anesthesia Preprocedure Evaluation (Signed)
Anesthesia Evaluation  Patient identified by MRN, date of birth, ID band Patient awake    Reviewed: Allergy & Precautions, NPO status , Patient's Chart, lab work & pertinent test results  Airway Mallampati: II   Neck ROM: full    Dental   Pulmonary neg pulmonary ROS,    breath sounds clear to auscultation       Cardiovascular hypertension,  Rhythm:regular Rate:Normal     Neuro/Psych    GI/Hepatic GERD  ,  Endo/Other    Renal/GU      Musculoskeletal   Abdominal   Peds  Hematology   Anesthesia Other Findings   Reproductive/Obstetrics                             Anesthesia Physical Anesthesia Plan  ASA: II  Anesthesia Plan: MAC   Post-op Pain Management:    Induction: Intravenous  Airway Management Planned: Simple Face Mask  Additional Equipment:   Intra-op Plan:   Post-operative Plan:   Informed Consent: I have reviewed the patients History and Physical, chart, labs and discussed the procedure including the risks, benefits and alternatives for the proposed anesthesia with the patient or authorized representative who has indicated his/her understanding and acceptance.     Plan Discussed with: CRNA, Anesthesiologist and Surgeon  Anesthesia Plan Comments:         Anesthesia Quick Evaluation

## 2015-05-22 ENCOUNTER — Encounter (HOSPITAL_COMMUNITY): Payer: Self-pay | Admitting: Gastroenterology

## 2015-05-22 NOTE — Anesthesia Postprocedure Evaluation (Signed)
Anesthesia Post Note  Patient: Jack Robinson  Procedure(s) Performed: Procedure(s) (LRB): COLONOSCOPY WITH PROPOFOL (N/A)  Patient location during evaluation: PACU Anesthesia Type: MAC Level of consciousness: awake and alert Pain management: pain level controlled Vital Signs Assessment: post-procedure vital signs reviewed and stable Respiratory status: spontaneous breathing, nonlabored ventilation, respiratory function stable and patient connected to nasal cannula oxygen Cardiovascular status: stable and blood pressure returned to baseline Anesthetic complications: no    Last Vitals:  Filed Vitals:   05/21/15 1230 05/21/15 1240  BP: 144/83 149/74  Pulse: 76 82  Temp:    Resp: 15 19    Last Pain: There were no vitals filed for this visit.               [place]

## 2015-12-18 ENCOUNTER — Encounter: Payer: 59 | Attending: Internal Medicine | Admitting: Dietician

## 2015-12-18 DIAGNOSIS — Z713 Dietary counseling and surveillance: Secondary | ICD-10-CM | POA: Insufficient documentation

## 2015-12-18 DIAGNOSIS — R7303 Prediabetes: Secondary | ICD-10-CM | POA: Diagnosis not present

## 2015-12-18 NOTE — Progress Notes (Signed)
  Medical Nutrition Therapy:  Appt start time: 215 end time:  305   Assessment:  Primary concerns today: Jack Robinson is here to "make sure he does not get diabetes." States that his HgbA1c was 6.8%. States that his doctor told him to lose 10 pounds; he has lost 10 pounds since June 2017. Patient states that he has quit drinking sodas and eating candy, cakes, and pies. Has increased green vegetables and has also been watching portion sizes. He states that he has a big appetite and "likes to eat all the bad foods."  Premier protein shake (strawberry - qty 2) Lot#: [id number] Exp: 11/2016  Preferred Learning Style:   No preference indicated   Learning Readiness:   Ready  Change in progress   MEDICATIONS: see list   DIETARY INTAKE:  Usual eating pattern includes 3 meals and 0 snacks per day.  Everyday foods include 1 orange with lunch and 1 small banana with dinner.  Avoided foods include concentrated sweets, squash, yogurt, eggs.    24-hr recall:  Wakes up around 5:30am B (7-7:30 AM): 1/2 a dry bagel  Snk ( AM):   L ( PM): orange, sandwich or leftovers, water Snk ( PM):  D ( PM): chicken or pork chops, salad/turnip greens/cabbage, sometimes potatoes, banana Snk ( PM):   Beverages: low calorie lemonade, water  Usual physical activity: walking 2x a day for 15-20 minutes  Estimated energy needs: 2000-2200 calories 225-248 g carbohydrates (4 carb choices with meals) 150-165 g protein 56-61 g fat  Progress Towards Goal(s):  In progress.   Nutritional Diagnosis:  [place]-2.2 Altered nutrition-related laboratory As related to history of excessive carboydrate intake, erratic meal pattern, and inappropriate food/beverage choices.  As evidenced by HgbA1c 6.8%.    Intervention:  Nutrition education provided. Praised patient on recent lifestyle changes and 10-lb weight loss. Explained basic macronutrient metabolism and effects on blood glucose.   Goals: -Continue exercise  routine -Continue to control your environment (avoid keeping trigger foods around) -Add a protein food to bagel at breakfast: sausage patty or Premier protein shake -Continue with structure and balance  Teaching Method Utilized: Visual Auditory Hands on  Handouts given during visit include:  Living Well with Diabetes  My Plate  Meal planning card  Barriers to learning/adherence to lifestyle change: none  Demonstrated degree of understanding via:  Teach Back   Monitoring/Evaluation:  Dietary intake, exercise, labs, and body weight prn.

## 2015-12-18 NOTE — Patient Instructions (Addendum)
-  Continue exercise routine -Continue to control your environment (avoid keeping trigger foods around) -Add a protein food to bagel at breakfast: sausage patty or Premier protein shake -Continue with structure and balance

## 2015-12-19 ENCOUNTER — Encounter: Payer: Self-pay | Admitting: Dietician

## 2016-09-10 ENCOUNTER — Other Ambulatory Visit: Payer: Self-pay | Admitting: Otolaryngology

## 2016-09-12 ENCOUNTER — Encounter (HOSPITAL_COMMUNITY): Payer: Self-pay

## 2016-09-12 ENCOUNTER — Encounter (HOSPITAL_COMMUNITY)
Admission: RE | Admit: 2016-09-12 | Discharge: 2016-09-12 | Disposition: A | Discharge status: Home / Self Care | Payer: 59 | Source: Ambulatory Visit | Attending: Otolaryngology | Admitting: Otolaryngology

## 2016-09-12 DIAGNOSIS — Z8521 Personal history of malignant neoplasm of larynx: Secondary | ICD-10-CM | POA: Diagnosis not present

## 2016-09-12 DIAGNOSIS — Z79899 Other long term (current) drug therapy: Secondary | ICD-10-CM | POA: Diagnosis not present

## 2016-09-12 DIAGNOSIS — Z923 Personal history of irradiation: Secondary | ICD-10-CM | POA: Diagnosis not present

## 2016-09-12 DIAGNOSIS — I10 Essential (primary) hypertension: Secondary | ICD-10-CM | POA: Diagnosis not present

## 2016-09-12 DIAGNOSIS — J381 Polyp of vocal cord and larynx: Secondary | ICD-10-CM | POA: Diagnosis not present

## 2016-09-12 DIAGNOSIS — Z7982 Long term (current) use of aspirin: Secondary | ICD-10-CM | POA: Diagnosis not present

## 2016-09-12 DIAGNOSIS — J383 Other diseases of vocal cords: Secondary | ICD-10-CM | POA: Diagnosis present

## 2016-09-12 HISTORY — DX: Prediabetes: R73.03

## 2016-09-12 LAB — BASIC METABOLIC PANEL
ANION GAP: 8 (ref 5–15)
BUN: 15 mg/dL (ref 6–20)
CO2: 27 mmol/L (ref 22–32)
CREATININE: 0.92 mg/dL (ref 0.61–1.24)
Calcium: 9.4 mg/dL (ref 8.9–10.3)
Chloride: 105 mmol/L (ref 101–111)
Glucose, Bld: 109 mg/dL — ABNORMAL HIGH (ref 65–99)
Potassium: 3.3 mmol/L — ABNORMAL LOW (ref 3.5–5.1)
SODIUM: 140 mmol/L (ref 135–145)

## 2016-09-12 LAB — CBC
HCT: 39.4 % (ref 39.0–52.0)
HEMOGLOBIN: 12.5 g/dL — AB (ref 13.0–17.0)
MCH: 27.8 pg (ref 26.0–34.0)
MCHC: 31.7 g/dL (ref 30.0–36.0)
MCV: 87.6 fL (ref 78.0–100.0)
PLATELETS: 195 10*3/uL (ref 150–400)
RBC: 4.5 MIL/uL (ref 4.22–5.81)
RDW: 13.4 % (ref 11.5–15.5)
WBC: 4.9 10*3/uL (ref 4.0–10.5)

## 2016-09-12 LAB — GLUCOSE, CAPILLARY: GLUCOSE-CAPILLARY: 115 mg/dL — AB (ref 65–99)

## 2016-09-12 NOTE — Pre-Procedure Instructions (Addendum)
SAHEED CARRINGTON  09/12/2016      [place], [place]. [place]. [place] [address] Phone: [phone] Fax: [phone]    Your procedure is scheduled on 09/15/16  Report to [facility] at 700 A.M.  Call this number if you have problems the morning of surgery:  [phone]   Remember:  Do not eat food or drink liquids after midnight.  Take these medicines the morning of surgery with A SIP OF WATER     Amlodipine(norvasc)  STOP all herbel meds, nsaids (aleve,naproxen,advil,ibuprofen)   prior to surgery starting Today 09/12/16 including all vitamins/supplements.aspirin   Do not wear jewelry, make-up or nail polish.  Do not wear lotions, powders, or perfumes, or deoderant.  Do not shave 48 hours prior to surgery.  Men may shave face and neck.  Do not bring valuables to the hospital.  [facility] is not responsible for any belongings or valuables.  Contacts, dentures or bridgework may not be worn into surgery.  Leave your suitcase in the car.  After surgery it may be brought to your room.  For patients admitted to the hospital, discharge time will be determined by your treatment team.  Patients discharged the day of surgery will not be allowed to drive home.   Special instructions:   Special Instructions: [facility] - Preparing for Surgery  Before surgery, you can play an important role.  Because skin is not sterile, your skin needs to be as free of germs as possible.  You can reduce the number of germs on you skin by washing with CHG (chlorahexidine gluconate) soap before surgery.  CHG is an antiseptic cleaner which kills germs and bonds with the skin to continue killing germs even after washing.  Please DO NOT use if you have an allergy to CHG or antibacterial soaps.  If your skin becomes reddened/irritated stop using the CHG and inform your nurse when you arrive at Short Stay.  Do not shave  (including legs and underarms) for at least 48 hours prior to the first CHG shower.  You may shave your face.  Please follow these instructions carefully:   1.  Shower with CHG Soap the night before surgery and the morning of Surgery.  2.  If you choose to wash your hair, wash your hair first as usual with your normal shampoo.  3.  After you shampoo, rinse your hair and body thoroughly to remove the Shampoo.  4.  Use CHG as you would any other liquid soap.  You can apply chg directly  to the skin and wash gently with scrungie or a clean washcloth.  5.  Apply the CHG Soap to your body ONLY FROM THE NECK DOWN.  Do not use on open wounds or open sores.  Avoid contact with your eyes ears, mouth and genitals (private parts).  Wash genitals (private parts)       with your normal soap.  6.  Wash thoroughly, paying special attention to the area where your surgery will be performed.  7.  Thoroughly rinse your body with warm water from the neck down.  8.  DO NOT shower/wash with your normal soap after using and rinsing off the CHG Soap.  9.  Pat yourself dry with a clean towel.            10.  Wear clean pajamas.  11.  Place clean sheets on your bed the night of your first shower and do not sleep with pets.  Day of Surgery  Do not apply any lotions/deodorants the morning of surgery.  Please wear clean clothes to the hospital/surgery center.  Please read over the  fact sheets that you were given.

## 2016-09-13 LAB — HEMOGLOBIN A1C
Hgb A1c MFr Bld: 6.1 % — ABNORMAL HIGH (ref 4.8–5.6)
MEAN PLASMA GLUCOSE: 128 mg/dL

## 2016-09-15 ENCOUNTER — Ambulatory Visit (HOSPITAL_COMMUNITY)
Admission: RE | Admit: 2016-09-15 | Discharge: 2016-09-15 | Disposition: A | Discharge status: Home / Self Care | Payer: 59 | Source: Ambulatory Visit | Attending: Otolaryngology | Admitting: Otolaryngology

## 2016-09-15 ENCOUNTER — Encounter (HOSPITAL_COMMUNITY)
Admission: RE | Disposition: A | Discharge status: Home / Self Care | Payer: Self-pay | Source: Ambulatory Visit | Attending: Otolaryngology

## 2016-09-15 ENCOUNTER — Ambulatory Visit (HOSPITAL_COMMUNITY): Payer: 59 | Admitting: Anesthesiology

## 2016-09-15 ENCOUNTER — Encounter (HOSPITAL_COMMUNITY): Payer: Self-pay | Admitting: *Deleted

## 2016-09-15 DIAGNOSIS — Z79899 Other long term (current) drug therapy: Secondary | ICD-10-CM | POA: Insufficient documentation

## 2016-09-15 DIAGNOSIS — J381 Polyp of vocal cord and larynx: Secondary | ICD-10-CM | POA: Diagnosis not present

## 2016-09-15 DIAGNOSIS — I10 Essential (primary) hypertension: Secondary | ICD-10-CM | POA: Insufficient documentation

## 2016-09-15 DIAGNOSIS — Z8521 Personal history of malignant neoplasm of larynx: Secondary | ICD-10-CM | POA: Insufficient documentation

## 2016-09-15 DIAGNOSIS — Z7982 Long term (current) use of aspirin: Secondary | ICD-10-CM | POA: Insufficient documentation

## 2016-09-15 DIAGNOSIS — Z923 Personal history of irradiation: Secondary | ICD-10-CM | POA: Insufficient documentation

## 2016-09-15 HISTORY — PX: MICROLARYNGOSCOPY: SHX5208

## 2016-09-15 SURGERY — MICROLARYNGOSCOPY
Anesthesia: General | Site: Mouth

## 2016-09-15 MED ORDER — ARTIFICIAL TEARS OPHTHALMIC OINT
TOPICAL_OINTMENT | OPHTHALMIC | Status: DC | PRN
  Administered 2016-09-15: 1 via OPHTHALMIC

## 2016-09-15 MED ORDER — EPINEPHRINE HCL (NASAL) 0.1 % NA SOLN
NASAL | Status: AC
  Filled 2016-09-15: qty 30

## 2016-09-15 MED ORDER — ONDANSETRON HCL 4 MG/2ML IJ SOLN
INTRAMUSCULAR | Status: DC | PRN
  Administered 2016-09-15: 4 mg via INTRAVENOUS

## 2016-09-15 MED ORDER — 0.9 % SODIUM CHLORIDE (POUR BTL) OPTIME
TOPICAL | Status: DC | PRN
  Administered 2016-09-15: 1000 mL

## 2016-09-15 MED ORDER — FENTANYL CITRATE (PF) 250 MCG/5ML IJ SOLN
INTRAMUSCULAR | Status: AC
  Filled 2016-09-15: qty 5

## 2016-09-15 MED ORDER — DEXAMETHASONE SODIUM PHOSPHATE 10 MG/ML IJ SOLN
INTRAMUSCULAR | Status: DC | PRN
  Administered 2016-09-15: 10 mg via INTRAVENOUS

## 2016-09-15 MED ORDER — DEXAMETHASONE SODIUM PHOSPHATE 10 MG/ML IJ SOLN
10.0000 mg | Freq: Once | INTRAMUSCULAR | Status: AC
  Administered 2016-09-15: 10 mg via INTRAVENOUS
  Filled 2016-09-15: qty 1

## 2016-09-15 MED ORDER — LACTATED RINGERS IV SOLN
INTRAVENOUS | Status: DC
  Administered 2016-09-15 (×2): via INTRAVENOUS

## 2016-09-15 MED ORDER — PROPOFOL 500 MG/50ML IV EMUL
INTRAVENOUS | Status: DC | PRN
  Administered 2016-09-15: 100 ug/kg/min via INTRAVENOUS

## 2016-09-15 MED ORDER — FENTANYL CITRATE (PF) 100 MCG/2ML IJ SOLN
INTRAMUSCULAR | Status: DC | PRN
  Administered 2016-09-15: 100 ug via INTRAVENOUS
  Administered 2016-09-15: 50 ug via INTRAVENOUS

## 2016-09-15 MED ORDER — MIDAZOLAM HCL 5 MG/5ML IJ SOLN
INTRAMUSCULAR | Status: DC | PRN
  Administered 2016-09-15: 2 mg via INTRAVENOUS

## 2016-09-15 MED ORDER — ROCURONIUM BROMIDE 100 MG/10ML IV SOLN
INTRAVENOUS | Status: DC | PRN
  Administered 2016-09-15: 50 mg via INTRAVENOUS
  Administered 2016-09-15: 20 mg via INTRAVENOUS

## 2016-09-15 MED ORDER — CHLORHEXIDINE GLUCONATE CLOTH 2 % EX PADS: 6.0000 | MEDICATED_PAD | Freq: Once | CUTANEOUS | Status: DC

## 2016-09-15 MED ORDER — PHENYLEPHRINE HCL 10 MG/ML IJ SOLN
INTRAVENOUS | Status: DC | PRN
  Administered 2016-09-15: 50 ug/min via INTRAVENOUS

## 2016-09-15 MED ORDER — MIDAZOLAM HCL 2 MG/2ML IJ SOLN
INTRAMUSCULAR | Status: AC
  Filled 2016-09-15: qty 2

## 2016-09-15 MED ORDER — CEFAZOLIN SODIUM-DEXTROSE 2-4 GM/100ML-% IV SOLN
2.0000 g | INTRAVENOUS | Status: AC
  Administered 2016-09-15: 2 g via INTRAVENOUS
  Filled 2016-09-15: qty 100

## 2016-09-15 MED ORDER — PROPOFOL 10 MG/ML IV BOLUS
INTRAVENOUS | Status: AC
  Filled 2016-09-15: qty 20

## 2016-09-15 MED ORDER — LIDOCAINE HCL (CARDIAC) 20 MG/ML IV SOLN
INTRAVENOUS | Status: DC | PRN
  Administered 2016-09-15: 100 mg via INTRAVENOUS

## 2016-09-15 MED ORDER — EPINEPHRINE HCL (NASAL) 0.1 % NA SOLN
NASAL | Status: DC | PRN
  Administered 2016-09-15: 30 mL via NASAL

## 2016-09-15 MED ORDER — SUGAMMADEX SODIUM 500 MG/5ML IV SOLN
INTRAVENOUS | Status: DC | PRN
  Administered 2016-09-15: 300 mg via INTRAVENOUS

## 2016-09-15 MED ORDER — PROPOFOL 10 MG/ML IV BOLUS
INTRAVENOUS | Status: DC | PRN
  Administered 2016-09-15: 160 mg via INTRAVENOUS

## 2016-09-15 SURGICAL SUPPLY — 25 items
BLADE SURG 15 STRL LF DISP TIS (BLADE) ×1 IMPLANT
BLADE SURG 15 STRL SS (BLADE) ×3
CANISTER SUCT 3000ML PPV (MISCELLANEOUS) ×3 IMPLANT
CONT SPEC 4OZ CLIKSEAL STRL BL (MISCELLANEOUS) IMPLANT
COVER BACK TABLE 60X90IN (DRAPES) ×3 IMPLANT
COVER MAYO STAND STRL (DRAPES) ×3 IMPLANT
DRAPE HALF SHEET 40X57 (DRAPES) ×3 IMPLANT
DRSG TELFA 3X8 NADH (GAUZE/BANDAGES/DRESSINGS) ×3 IMPLANT
GAUZE SPONGE 4X4 16PLY XRAY LF (GAUZE/BANDAGES/DRESSINGS) ×3 IMPLANT
GLOVE BIOGEL M 7.0 STRL (GLOVE) ×3 IMPLANT
GUARD TEETH (MISCELLANEOUS) ×3 IMPLANT
H R LUBE JELLY XXX (MISCELLANEOUS) IMPLANT
KIT BASIN OR (CUSTOM PROCEDURE TRAY) IMPLANT
KIT ROOM TURNOVER OR (KITS) ×3 IMPLANT
NEEDLE 18GX1X1/2 (RX/OR ONLY) (NEEDLE) IMPLANT
NEEDLE 22X1 1/2 (OR ONLY) (NEEDLE) IMPLANT
NEEDLE HYPO 25GX1X1/2 BEV (NEEDLE) IMPLANT
NS IRRIG 1000ML POUR BTL (IV SOLUTION) ×3 IMPLANT
PAD ARMBOARD 7.5X6 YLW CONV (MISCELLANEOUS) ×3 IMPLANT
PATTIES SURGICAL .5 X1 (DISPOSABLE) ×3 IMPLANT
SOLUTION ANTI FOG 6CC (MISCELLANEOUS) ×3 IMPLANT
SPONGE GAUZE 4X4 12PLY STER LF (GAUZE/BANDAGES/DRESSINGS) IMPLANT
TOWEL OR 17X24 6PK STRL BLUE (TOWEL DISPOSABLE) ×3 IMPLANT
TUBE CONNECTING 12'X1/4 (SUCTIONS) ×1
TUBE CONNECTING 12X1/4 (SUCTIONS) ×2 IMPLANT

## 2016-09-15 NOTE — Anesthesia Postprocedure Evaluation (Signed)
Anesthesia Post Note  Patient: Jack Robinson  Procedure(s) Performed: Procedure(s) (LRB): MICROLARYNGOSCOPY WITH EXCISION OF VOCAL CORD POLYP (N/A)     Patient location during evaluation: PACU Anesthesia Type: General Level of consciousness: awake and alert Pain management: pain level controlled Vital Signs Assessment: post-procedure vital signs reviewed and stable Respiratory status: spontaneous breathing, nonlabored ventilation, respiratory function stable and patient connected to nasal cannula oxygen Cardiovascular status: blood pressure returned to baseline and stable Postop Assessment: no signs of nausea or vomiting Anesthetic complications: no    Last Vitals:  Vitals:   09/15/16 1000 09/15/16 1015  BP: 138/81 133/76  Pulse: 71 65  Resp: 16 16  Temp:  36.6 C    Last Pain:  Vitals:   09/15/16 0945  TempSrc:   PainSc: 0-No pain                 Nadean Montanaro,JAMES TERRILL

## 2016-09-15 NOTE — Anesthesia Procedure Notes (Signed)
Procedure Name: General with mask airway Date/Time: 09/15/2016 9:13 AM Performed by: Suzy Bouchard Pre-anesthesia Checklist: Patient identified, Emergency Drugs available, Suction available, Patient being monitored and Timeout performed Patient Re-evaluated:Patient Re-evaluated prior to inductionOxygen Delivery Method: Circle system utilized Preoxygenation: Pre-oxygenation with 100% oxygen Intubation Type: IV induction Ventilation: Mask ventilation without difficulty Dental Injury: Teeth and Oropharynx as per pre-operative assessment  Comments: General with mask; airway then turned to Dr. Wilburn Cornelia.  Positioned jet ventilator for procedure.  Easy Jet Vent/ TIVA.

## 2016-09-15 NOTE — Anesthesia Preprocedure Evaluation (Addendum)
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  Reviewed: Allergy & Precautions, NPO status , Patient's Chart, lab work & pertinent test results  Airway Mallampati: I  TM Distance: >3 FB Neck ROM: Full   Comment: History of radiation to neck/vocal cord Dental  (+) Teeth Intact, Dental Advisory Given   Pulmonary neg pulmonary ROS,    breath sounds clear to auscultation       Cardiovascular hypertension,  Rhythm:Regular Rate:Normal     Neuro/Psych negative neurological ROS     GI/Hepatic Neg liver ROS, GERD  ,  Endo/Other    Renal/GU negative Renal ROS     Musculoskeletal   Abdominal   Peds  Hematology   Anesthesia Other Findings   Reproductive/Obstetrics                           Anesthesia Physical Anesthesia Plan  ASA: III  Anesthesia Plan: General   Post-op Pain Management:    Induction: Intravenous  PONV Risk Score and Plan: 3 and Ondansetron, Dexamethasone, Propofol and Midazolam  Airway Management Planned:   Additional Equipment:   Intra-op Plan:   Post-operative Plan: Extubation in OR  Informed Consent: I have reviewed the patients History and Physical, chart, labs and discussed the procedure including the risks, benefits and alternatives for the proposed anesthesia with the patient or authorized representative who has indicated his/her understanding and acceptance.   Dental advisory given  Plan Discussed with:   Anesthesia Plan Comments:         Anesthesia Quick Evaluation

## 2016-09-15 NOTE — Transfer of Care (Signed)
Immediate Anesthesia Transfer of Care Note  Patient: Jack Robinson  Procedure(s) Performed: Procedure(s): MICROLARYNGOSCOPY WITH EXCISION OF VOCAL CORD POLYP (N/A)  Patient Location: PACU  Anesthesia Type:General  Level of Consciousness: awake, alert  and oriented  Airway & Oxygen Therapy: Patient Spontanous Breathing and Patient connected to nasal cannula oxygen  Post-op Assessment: Report given to RN, Post -op Vital signs reviewed and stable and Patient moving all extremities X 4  Post vital signs: Reviewed and stable  Last Vitals:  Vitals:   09/15/16 0747 09/15/16 0945  BP: (!) 150/75   Pulse: 83 (P) 78  Resp: 20 (P) 12  Temp: 36.8 C (P) 36.7 C    Last Pain:  Vitals:   09/15/16 0945  TempSrc:   PainSc: (P) 0-No pain      Patients Stated Pain Goal: 6 (54/27/06 2376)  Complications: No apparent anesthesia complications

## 2016-09-15 NOTE — H&P (Signed)
ROBEY Robinson is an 66 y.o. male.   Chief Complaint: Hoarseness HPI: hx of VC SCCa, s/p XRT.  Left VC mass  Past Medical History:  Diagnosis Date  . 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 Chronic hoarseness    4 to 5 months  . GERD (gastroesophageal reflux disease)    pt. denies this is a problem  . History of radiation therapy 02/16/12-03/26/12   vocal cord /glottis ca  . Hx of echocardiogram    a. Echo (11/15):  mild LVH, EF 60-65%, Gr 2 DD, trivial AI, mild BAE  . Hypertension   . Pre-diabetes    per patient    Past Surgical History:  Procedure Laterality Date  . COLONOSCOPY W/ POLYPECTOMY  01/2010   every 5 years  . COLONOSCOPY WITH PROPOFOL N/A 05/21/2015   Procedure: COLONOSCOPY WITH PROPOFOL;  Surgeon: Garlan Fair, MD;  Location: [facility];  Service: Endoscopy;  Laterality: N/A;  . MICROLARYNGOSCOPY  01/19/2012   Procedure: MICROLARYNGOSCOPY;  Surgeon: Jerrell Belfast, MD;  Location: [facility];  Service: ENT;  Laterality: Left;  MICROLARYNGOSCOPY WITH EXCISON OF VOCAL CORD POLYP  . MICROLARYNGOSCOPY Left 10/16/2014   Procedure: MICROLARYNGOSCOPY WITH BIOPSY OF VOCAL MASS; LEFT;  Surgeon: Jerrell Belfast, MD;  Location: [facility];  Service: ENT;  Laterality: Left;  . TRANSURETHRAL RESECTION OF PROSTATE N/A 02/08/2015   Procedure: TRANSURETHRAL RESECTION OF THE PROSTATE (TURP);  Surgeon: Irine Seal, MD;  Location: [facility];  Service: Urology;  Laterality: N/A;  . vocal cord biopsy  01/19/12   right polyp bx=/ invasive squamous cell ca    History reviewed. No pertinent family history. Social History:  reports that he has never smoked. He has never used smokeless tobacco. He reports that he does not drink alcohol or use drugs.  Allergies:  Allergies  Allergen Reactions  . No Known Allergies     Medications Prior to Admission  Medication Sig Dispense Refill  . amLODipine (NORVASC) 10 MG tablet Take 10 mg by mouth daily.    .  cetirizine (ZYRTEC) 10 MG tablet Take 10 mg by mouth daily.    . Cholecalciferol (VITAMIN D3) 1000 units CAPS Take 1,000 Units by mouth daily.    Marland Kitchen ezetimibe-simvastatin (VYTORIN) 10-20 MG per tablet Take 1 tablet by mouth daily.    Marland Kitchen lisinopril (PRINIVIL,ZESTRIL) 20 MG tablet Take 20 mg by mouth every evening.    Marland Kitchen lisinopril-hydrochlorothiazide (PRINZIDE,ZESTORETIC) 20-25 MG per tablet Take 1 tablet by mouth every morning.     . potassium chloride SA (K-DUR,KLOR-CON) 20 MEQ tablet Take 20 mEq by mouth daily.    . sodium chloride (OCEAN) 0.65 % SOLN nasal spray Place 1 spray into both nostrils as needed for congestion.    Marland Kitchen aspirin EC 81 MG tablet Take 81 mg by mouth daily.    Marland Kitchen docusate sodium (COLACE) 100 MG capsule Take 1 capsule (100 mg total) by mouth 2 (two) times daily. (Patient not taking: Reported on 09/10/2016) 60 capsule 2  . HYDROcodone-acetaminophen (NORCO) 5-325 MG tablet Take 1 tablet by mouth every 6 (six) hours as needed for moderate pain. (Patient not taking: Reported on 09/10/2016) 12 tablet 0    No results found for this or any previous visit (from the past 48 hour(s)). No results found.  Review of Systems  Constitutional: Negative.   HENT: Negative.   Respiratory: Negative.   Cardiovascular: Negative.     Blood pressure (!) 150/75, pulse 83, temperature 98.3  F (36.8 C), temperature source Oral, resp. rate 20, weight 82.6 kg (182 lb), SpO2 100 %. Physical Exam  Constitutional: He appears well-developed and well-nourished.  HENT:  Vocal cord mass  Neck: Normal range of motion. Neck supple.  Cardiovascular: Normal rate.   Respiratory: Effort normal.     Assessment/Plan Adm for OP MicroDL and excision of VC mass.  Kaavya Puskarich, MD 09/15/2016, 8:39 AM

## 2016-09-15 NOTE — Op Note (Signed)
NAMEROYER, CRISTOBAL               ACCOUNT NO.:  [id number]  MEDICAL RECORD NO.:  [id number]  LOCATION:                                 FACILITY:  PHYSICIAN:  Early Chars. Wilburn Cornelia, M.D.DATE OF BIRTH:  01-28-1951  DATE OF PROCEDURE:  09/15/2016 DATE OF DISCHARGE:                              OPERATIVE REPORT   LOCATION:  [facility] OR.  PREOPERATIVE DIAGNOSES: 1. Left vocal cord mass. 2. Chronic hoarseness. 3. History of squamous cell carcinoma of the larynx, status post     radiation therapy.  POSTOPERATIVE DIAGNOSES: 1. Left vocal cord mass. 2. Chronic hoarseness. 3. History of squamous cell carcinoma of the larynx, status post     radiation therapy.  INDICATIONS FOR SURGERY: 1. Left vocal cord mass. 2. Chronic hoarseness. 3. History of squamous cell carcinoma of the larynx, status post     radiation therapy.  PROCEDURES:  Microlaryngoscopy with excision of left vocal cord mass.  ANESTHESIA:  General/jet ventilation.  COMPLICATIONS:  None.  BLOOD LOSS:  Minimal.  SURGEON:  Kasim Mccorkle L. Wilburn Cornelia, M.D.  The patient is transferred from the operating room to the recovery room in stable condition.  BRIEF HISTORY:  The patient is a 66 year old black male, who has been followed with a history of chronic hoarseness.  He was diagnosed with right vocal cord cancer and underwent radiation therapy, which completed over 6 years ago.  The patient has chronic hoarseness and low-grade reflux and has been followed on an ongoing basis in our office.  On recent laryngoscopy, the patient was found to have a small nodular mass on the superior surface of the left vocal cord.  Given his history and findings, I recommended excisional biopsy of the mass.  Risks and benefits of procedure were discussed in detail with the patient's wife and they understood and agreed with our plan for surgery, which is scheduled on elective basis at [facility].  DESCRIPTION OF  PROCEDURE:  The patient was brought to the operating room on September 15, 2016, and placed in supine position on the operating table. General anesthesia was established without difficulty.  A surgical time- out was then performed with correct identification of the patient and the surgical procedure.  A laser/ventilation laryngoscope was inserted and supported with a suspension device.  The operating microscope was moved into position and examination of the patient's larynx was undertaken.  The patient had a small 3 mm nodular mass on the superior surface of the left mid vocal cord.  No other abnormal findings, ulcers, or lesions were noted.  Using cup forceps and micro scissors, the area was completely excised, preserving the underlying vocal cord and soft tissue.  The biopsied specimens were then sent to Pathology for gross microscopic evaluation.  There was minimal bleeding.  Topical anesthetic was applied.  This consisted of 1% lidocaine.  The patient's ventilatory status was maintained using jet ventilation throughout the surgical procedure.  At the conclusion of the procedure, the laryngoscope was removed without difficulty.  There were no loose or broken teeth and no soft tissue trauma.  The patient was then awakened from his anesthetic and was transferred from the  operating room to the recovery room in stable condition.    ______________________________ Early Chars Wilburn Cornelia, M.D.   ______________________________ Early Chars. Wilburn Cornelia, M.D.    DLS/MEDQ  D:  24/81/8590  T:  09/15/2016  Job:  [id number]

## 2016-09-15 NOTE — Brief Op Note (Signed)
09/15/2016  9:38 AM  PATIENT:  Jack Robinson  66 y.o. male  PRE-OPERATIVE DIAGNOSIS:  LEFT VOCAL CORD POLYP  POST-OPERATIVE DIAGNOSIS:  LEFT VOCAL CORD POLYP  PROCEDURE:  Procedure(s): MICROLARYNGOSCOPY WITH EXCISION OF VOCAL CORD POLYP (N/A)  SURGEON:  Surgeon(s) and Role:    Jerrell Belfast, MD - Primary  PHYSICIAN ASSISTANT:   ASSISTANTS: none   ANESTHESIA:   general  EBL:  No intake/output data recorded.  BLOOD ADMINISTERED:none  DRAINS: none   LOCAL MEDICATIONS USED:  LIDOCAINE   SPECIMEN:  Source of Specimen:  left vocal cord mass  DISPOSITION OF SPECIMEN:  PATHOLOGY  COUNTS:  YES  TOURNIQUET:  * No tourniquets in log *  DICTATION: .Other Dictation: Dictation Number [id number]  PLAN OF CARE: Discharge to home after PACU  PATIENT DISPOSITION:  PACU - hemodynamically stable.   Delay start of Pharmacological VTE agent (>24hrs) due to surgical blood loss or risk of bleeding: not applicable

## 2016-09-16 ENCOUNTER — Encounter (HOSPITAL_COMMUNITY): Payer: Self-pay | Admitting: Otolaryngology

## 2016-10-09 IMAGING — CR DG CHEST 2V
2 series · 2 of 2 positions shown · non-contrast
Comparison: 01/15/2012

CLINICAL DATA: Near-syncope, elevated troponin

EXAM:
CHEST  2 VIEW

[w chest pa]
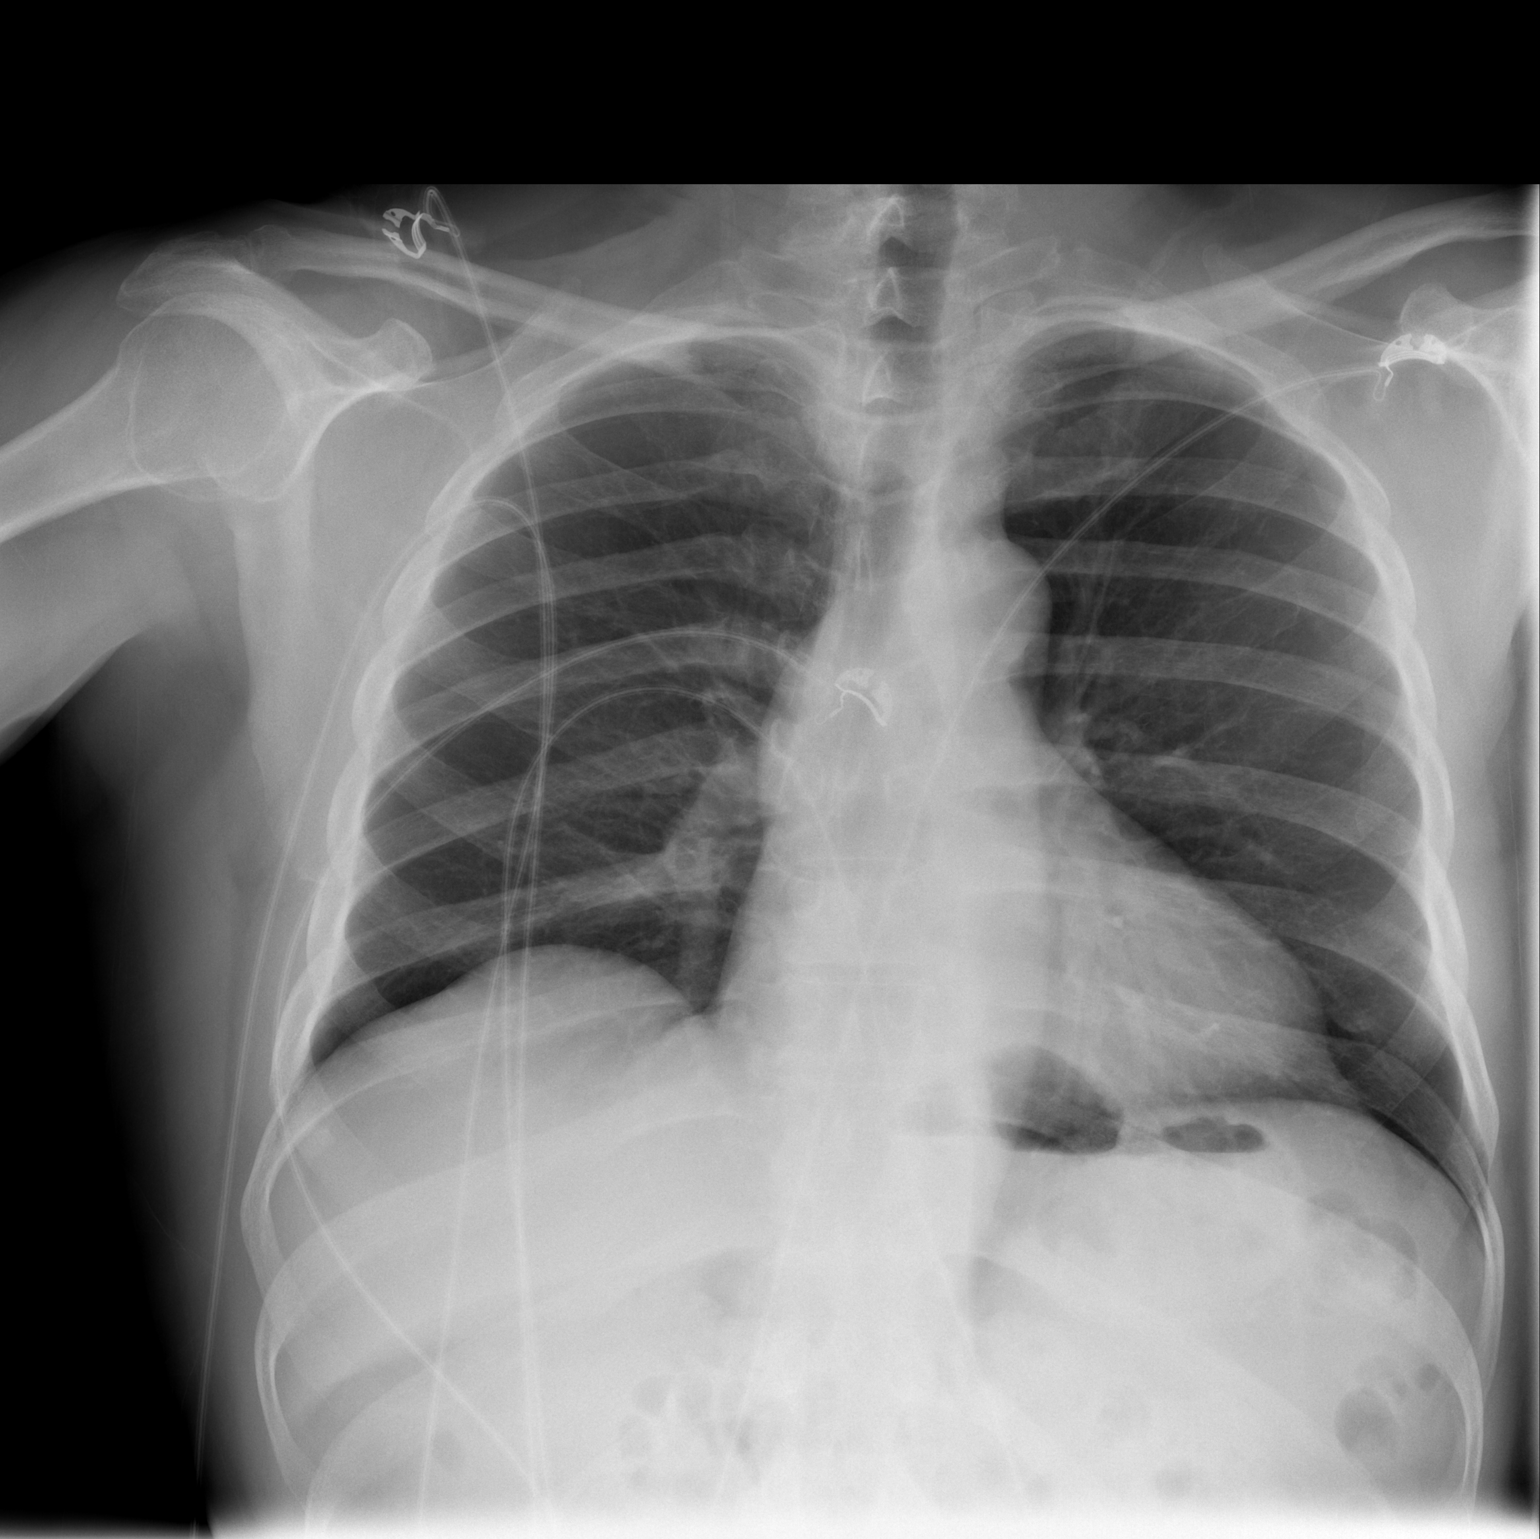

[w chest lat]
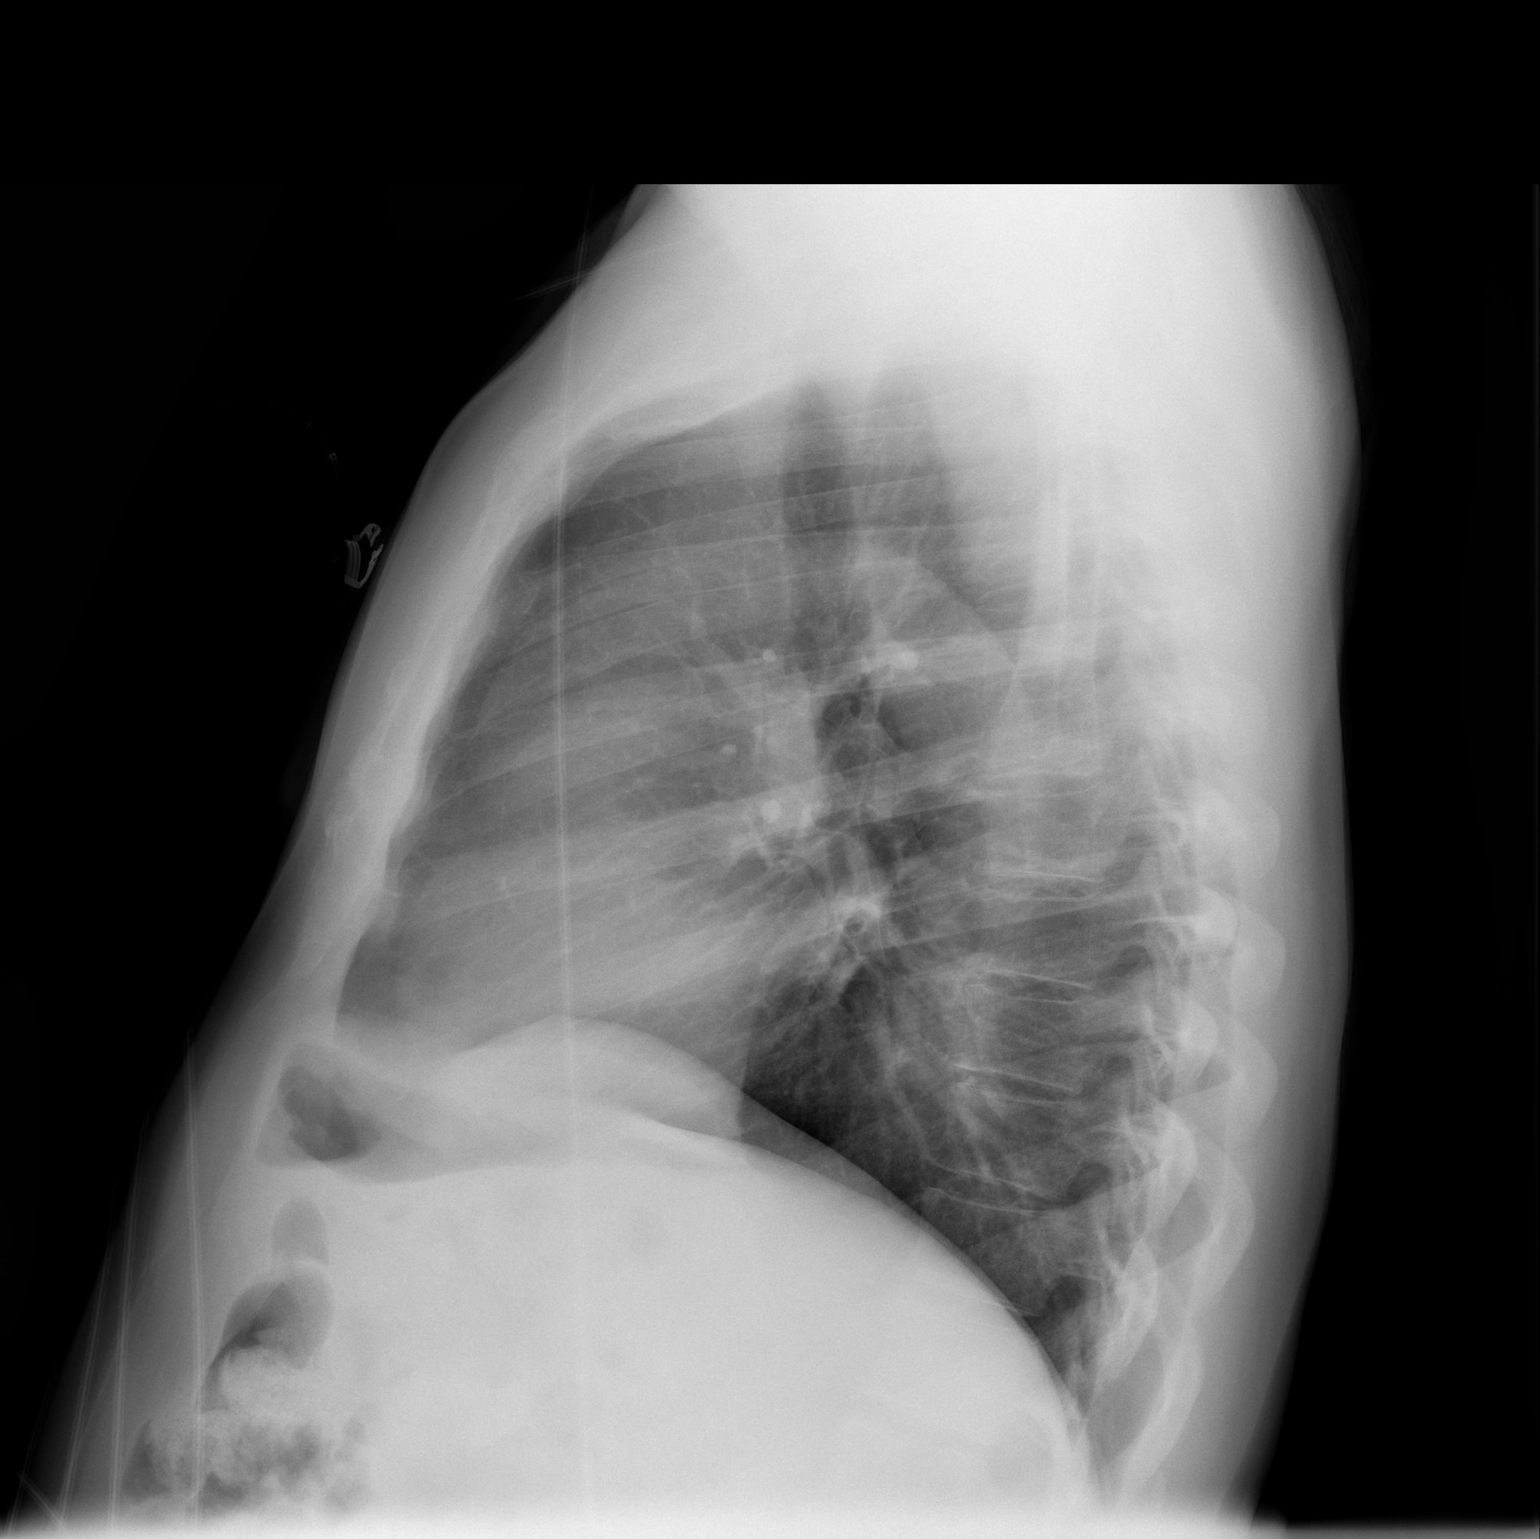

[2 of 2 positions shown; findings below may reference images not displayed]

FINDINGS: Cardiomediastinal silhouette is stable. No acute infiltrate or
pleural effusion. No pulmonary edema. Bony thorax is unremarkable.
IMPRESSION: No active cardiopulmonary disease.

## 2018-09-02 ENCOUNTER — Other Ambulatory Visit: Payer: Self-pay | Admitting: Otolaryngology

## 2018-09-08 NOTE — Pre-Procedure Instructions (Signed)
Jack Robinson  09/08/2018      [place], [address]. [place]. [place] [address] Phone: [phone] Fax: [phone]    Your procedure is scheduled on 09/17/18.  Report to [facility] Admitting at 530 A.M.  Call this number if you have problems the morning of surgery:  [phone]   Remember:      Take these medicines the morning of surgery with A SIP OF WATER ---AMLODIPINE,METOPROLOL    Do not wear jewelry, make-up or nail polish.  Do not wear lotions, powders, or perfumes, or deodorant.  Do not shave 48 hours prior to surgery.  Men may shave face and neck.  Do not bring valuables to the hospital.  [facility] is not responsible for any belongings or valuables.  Contacts, dentures or bridgework may not be worn into surgery.  Leave your suitcase in the car.  After surgery it may be brought to your room.  For patients admitted to the hospital, discharge time will be determined by your treatment team.  Patients discharged the day of surgery will not be allowed to drive home.    Special instructions: ----Do not take any aspirin,anti-inflammatories,vitamins,or herbal supplements 5-7 days prior to surgery.  Please read over the following fact sheets that you were given. [facility] - Preparing for Surgery  Before surgery, you can play an important role.  Because skin is not sterile, your skin needs to be as free of germs as possible.  You can reduce the number of germs on you skin by washing with CHG (chlorahexidine gluconate) soap before surgery.  CHG is an antiseptic cleaner which kills germs and bonds with the skin to continue killing germs even after washing.  Oral Hygiene is also important in reducing the risk of infection.  Remember to brush your teeth with your regular toothpaste the morning of surgery.  Please DO NOT use if you have an allergy to CHG or antibacterial soaps.  If your skin becomes  reddened/irritated stop using the CHG and inform your nurse when you arrive at Short Stay.  Do not shave (including legs and underarms) for at least 48 hours prior to the first CHG shower.  You may shave your face.  Please follow these instructions carefully:   1.  Shower with CHG Soap the night before surgery and the morning of Surgery.  2.  If you choose to wash your hair, wash your hair first as usual with your normal shampoo.  3.  After you shampoo, rinse your hair and body thoroughly to remove the shampoo. 4.  Use CHG as you would any other liquid soap.  You can apply chg directly to the skin and wash gently with a      scrungie or washcloth.           5.  Apply the CHG Soap to your body ONLY FROM THE NECK DOWN.   Do not use on open wounds or open sores. Avoid contact with your eyes, ears, mouth and genitals (private parts).  Wash genitals (private parts) with your normal soap.  6.  Wash thoroughly, paying special attention to the area where your surgery will be performed.  7.  Thoroughly rinse your body with warm water from the neck down.  8.  DO NOT shower/wash with your normal soap after using and rinsing off the CHG Soap.  9.  Pat yourself dry with a clean towel.  10.  Wear clean pajamas.            11.  Place clean sheets on your bed the night of your first shower and do not sleep with pets.  Day of Surgery  Do not apply any lotions/deoderants the morning of surgery.   Please wear clean clothes to the hospital/surgery center. Remember to brush your teeth with toothpaste.  [facility] - Preparing for Surgery  Before surgery, you can play an important role.  Because skin is not sterile, your skin needs to be as free of germs as possible.  You can reduce the number of germs on you skin by washing with CHG (chlorahexidine gluconate) soap before surgery.  CHG is an antiseptic cleaner which kills germs and bonds with the skin to continue killing germs even after washing.   Oral Hygiene is also important in reducing the risk of infection.  Remember to brush your teeth with your regular toothpaste the morning of surgery.  Please DO NOT use if you have an allergy to CHG or antibacterial soaps.  If your skin becomes reddened/irritated stop using the CHG and inform your nurse when you arrive at Short Stay.  Do not shave (including legs and underarms) for at least 48 hours prior to the first CHG shower.  You may shave your face.  Please follow these instructions carefully:   1.  Shower with CHG Soap the night before surgery and the morning of Surgery.  2.  If you choose to wash your hair, wash your hair first as usual with your normal shampoo.  3.  After you shampoo, rinse your hair and body thoroughly to remove the shampoo. 4.  Use CHG as you would any other liquid soap.  You can apply chg directly to the skin and wash gently with a      scrungie or washcloth.           5.  Apply the CHG Soap to your body ONLY FROM THE NECK DOWN.   Do not use on open wounds or open sores. Avoid contact with your eyes, ears, mouth and genitals (private parts).  Wash genitals (private parts) with your normal soap.  6.  Wash thoroughly, paying special attention to the area where your surgery will be performed.  7.  Thoroughly rinse your body with warm water from the neck down.  8.  DO NOT shower/wash with your normal soap after using and rinsing off the CHG Soap.  9.  Pat yourself dry with a clean towel.            10.  Wear clean pajamas.            11.  Place clean sheets on your bed the night of your first shower and do not sleep with pets.  Day of Surgery  Do not apply any lotions/deoderants the morning of surgery.   Please wear clean clothes to the hospital/surgery center. Remember to brush your teeth with toothpaste.

## 2018-09-09 ENCOUNTER — Encounter (HOSPITAL_COMMUNITY)
Admission: RE | Admit: 2018-09-09 | Discharge: 2018-09-09 | Disposition: A | Discharge status: Home / Self Care | Payer: 59 | Source: Ambulatory Visit | Attending: Otolaryngology | Admitting: Otolaryngology

## 2018-09-09 ENCOUNTER — Encounter (HOSPITAL_COMMUNITY): Payer: Self-pay

## 2018-09-09 ENCOUNTER — Other Ambulatory Visit: Payer: Self-pay

## 2018-09-09 DIAGNOSIS — I10 Essential (primary) hypertension: Secondary | ICD-10-CM | POA: Insufficient documentation

## 2018-09-09 DIAGNOSIS — Z01818 Encounter for other preprocedural examination: Secondary | ICD-10-CM | POA: Insufficient documentation

## 2018-09-09 LAB — CBC
HCT: 40.8 % (ref 39.0–52.0)
Hemoglobin: 13.1 g/dL (ref 13.0–17.0)
MCH: 27.9 pg (ref 26.0–34.0)
MCHC: 32.1 g/dL (ref 30.0–36.0)
MCV: 87 fL (ref 80.0–100.0)
Platelets: 201 10*3/uL (ref 150–400)
RBC: 4.69 MIL/uL (ref 4.22–5.81)
RDW: 12.6 % (ref 11.5–15.5)
WBC: 5.5 10*3/uL (ref 4.0–10.5)
nRBC: 0 % (ref 0.0–0.2)

## 2018-09-09 LAB — BASIC METABOLIC PANEL
Anion gap: 12 (ref 5–15)
BUN: 13 mg/dL (ref 8–23)
CO2: 23 mmol/L (ref 22–32)
Calcium: 9.3 mg/dL (ref 8.9–10.3)
Chloride: 103 mmol/L (ref 98–111)
Creatinine, Ser: 0.94 mg/dL (ref 0.61–1.24)
GFR calc Af Amer: >60 mL/min (ref >60)
GFR calc non Af Amer: >60 mL/min (ref >60)
Glucose, Bld: 92 mg/dL (ref 70–99)
Potassium: 3 mmol/L — ABNORMAL LOW (ref 3.5–5.1)
Sodium: 138 mmol/L (ref 135–145)

## 2018-09-09 NOTE — Progress Notes (Signed)
PCP - Dr. Nicki Reaper [facility]  Cardiologist - denies  Chest x-ray - N/A EKG - 09/09/18 Stress Test - denies ECHO - 01/2014 Cardiac Cath - denies  Sleep Study - denies CPAP - denies  Blood Thinner Instructions:N/A Aspirin Instructions:LD 09/02/18  Anesthesia review: No  Patient denies shortness of breath, fever, cough and chest pain at PAT appointment   Patient verbalized understanding of instructions that were given to them at the PAT appointment. Patient was also instructed that they will need to review over the PAT instructions again at home before surgery.  Scheduled COVID testing on Tuesday, 6/16. Pt educated on quarantining after testing. Verbalizes understanding.    Coronavirus Screening  Have you experienced the following symptoms:  Cough yes/no: No Fever (>100.38F)  yes/no: No Runny nose yes/no: No Sore throat yes/no: No Difficulty breathing/shortness of breath  yes/no: No  Have you or a family member traveled in the last 14 days and where? yes/no: No   If the patient indicates "YES" to the above questions, their PAT will be rescheduled to limit the exposure to others and, the surgeon will be notified. THE PATIENT WILL NEED TO BE ASYMPTOMATIC FOR 14 DAYS.   If the patient is not experiencing any of these symptoms, the PAT nurse will instruct them to NOT bring anyone with them to their appointment since they may have these symptoms or traveled as well.   Please remind your patients and families that hospital visitation restrictions are in effect and the importance of the restrictions.

## 2018-09-09 NOTE — Pre-Procedure Instructions (Signed)
[place], [address] [place] [address] Phone: [phone] Fax: [phone]      Your procedure is scheduled on Friday, June 19th.  Report to [facility] Main Entrance "A" at 5:30 A.M., and check in at the Admitting office.  Call this number if you have problems the morning of surgery:  [phone]  Call [phone] if you have any questions prior to your surgery date Monday-Friday 8am-4pm    Remember:  Do not eat or drink after midnight.    Take these medicines the morning of surgery with A SIP OF WATER:  amLODipine (NORVASC) metoprolol succinate (TOPROL-XL)-take the night before surgery.  Follow your surgeon's instructions on when to stop Aspirin.  If no instructions were given by your surgeon then you will need to call the office to get those instructions.    7 days prior to surgery (09/10/18) STOP taking any Aspirin (unless otherwise instructed by your surgeon), Aleve, Naproxen, Ibuprofen, Motrin, Advil, Goody's, BC's, all herbal medications, fish oil, and all vitamins.    The Morning of Surgery  Do not wear jewelry.  Do not wear lotions, powders, or colognes, or deodorant  Do not shave 48 hours prior to surgery.  Men may shave face and neck.  Do not bring valuables to the hospital.  [facility] is not responsible for any belongings or valuables.  If you are a smoker, DO NOT Smoke 24 hours prior to surgery IF you wear a CPAP at night please bring your mask, tubing, and machine the morning of surgery   Remember that you must have someone to transport you home after your surgery, and remain with you for 24 hours if you are discharged the same day.   Contacts, glasses, hearing aids, dentures or bridgework may not be worn into surgery.   Leave your suitcase in the car.  After surgery it may be brought to your room.  For patients admitted to the hospital, discharge time will be determined by your  treatment team.  Patients discharged the day of surgery will not be allowed to drive home.    Special instructions:   [place]- Preparing For Surgery  Before surgery, you can play an important role. Because skin is not sterile, your skin needs to be as free of germs as possible. You can reduce the number of germs on your skin by washing with CHG (chlorahexidine gluconate) Soap before surgery.  CHG is an antiseptic cleaner which kills germs and bonds with the skin to continue killing germs even after washing.    Oral Hygiene is also important to reduce your risk of infection.  Remember - BRUSH YOUR TEETH THE MORNING OF SURGERY WITH YOUR REGULAR TOOTHPASTE  Please do not use if you have an allergy to CHG or antibacterial soaps. If your skin becomes reddened/irritated stop using the CHG.  Do not shave (including legs and underarms) for at least 48 hours prior to first CHG shower. It is OK to shave your face.  Please follow these instructions carefully.   1. Shower the NIGHT BEFORE SURGERY and the MORNING OF SURGERY with CHG Soap.   2. If you chose to wash your hair, wash your hair first as usual with your normal shampoo.  3. After you shampoo, rinse your hair and body thoroughly to remove the shampoo.  4. Use CHG as you would any other liquid soap. You can apply CHG directly to the skin and wash gently  with a scrungie or a clean washcloth.   5. Apply the CHG Soap to your body ONLY FROM THE NECK DOWN.  Do not use on open wounds or open sores. Avoid contact with your eyes, ears, mouth and genitals (private parts). Wash Face and genitals (private parts)  with your normal soap.   6. Wash thoroughly, paying special attention to the area where your surgery will be performed.  7. Thoroughly rinse your body with warm water from the neck down.  8. DO NOT shower/wash with your normal soap after using and rinsing off the CHG Soap.  9. Pat yourself dry with a CLEAN TOWEL.  10. Wear CLEAN  PAJAMAS to bed the night before surgery, wear comfortable clothes the morning of surgery  11. Place CLEAN SHEETS on your bed the night of your first shower and DO NOT SLEEP WITH PETS.    Day of Surgery: Shower as stated above Do not apply any deodorants/lotions.  Please wear clean clothes to the hospital/surgery center.   Remember to brush your teeth WITH YOUR REGULAR TOOTHPASTE.   Please read over the following fact sheets that you were given.

## 2018-09-14 ENCOUNTER — Other Ambulatory Visit (HOSPITAL_COMMUNITY)
Admission: RE | Admit: 2018-09-14 | Discharge: 2018-09-14 | Disposition: A | Discharge status: Home / Self Care | Payer: 59 | Source: Ambulatory Visit | Attending: Otolaryngology | Admitting: Otolaryngology

## 2018-09-14 DIAGNOSIS — Z1159 Encounter for screening for other viral diseases: Secondary | ICD-10-CM | POA: Diagnosis present

## 2018-09-15 LAB — NOVEL CORONAVIRUS, NAA (HOSP ORDER, SEND-OUT TO REF LAB; TAT 18-24 HRS): SARS-CoV-2, NAA: NOT DETECTED

## 2018-09-17 ENCOUNTER — Encounter (HOSPITAL_COMMUNITY): Payer: Self-pay

## 2018-09-17 ENCOUNTER — Ambulatory Visit (HOSPITAL_COMMUNITY)
Admission: RE | Admit: 2018-09-17 | Discharge: 2018-09-17 | Disposition: A | Discharge status: Home / Self Care | Payer: 59 | Attending: Otolaryngology | Admitting: Otolaryngology

## 2018-09-17 ENCOUNTER — Encounter (HOSPITAL_COMMUNITY)
Admission: RE | Disposition: A | Discharge status: Home / Self Care | Payer: Self-pay | Source: Home / Self Care | Attending: Otolaryngology

## 2018-09-17 ENCOUNTER — Ambulatory Visit (HOSPITAL_COMMUNITY): Payer: 59 | Admitting: Certified Registered Nurse Anesthetist

## 2018-09-17 ENCOUNTER — Other Ambulatory Visit: Payer: Self-pay

## 2018-09-17 DIAGNOSIS — Z8521 Personal history of malignant neoplasm of larynx: Secondary | ICD-10-CM | POA: Diagnosis not present

## 2018-09-17 DIAGNOSIS — Z923 Personal history of irradiation: Secondary | ICD-10-CM | POA: Insufficient documentation

## 2018-09-17 DIAGNOSIS — J382 Nodules of vocal cords: Secondary | ICD-10-CM | POA: Diagnosis not present

## 2018-09-17 DIAGNOSIS — Z7982 Long term (current) use of aspirin: Secondary | ICD-10-CM | POA: Diagnosis not present

## 2018-09-17 DIAGNOSIS — R490 Dysphonia: Secondary | ICD-10-CM | POA: Diagnosis present

## 2018-09-17 DIAGNOSIS — Z79899 Other long term (current) drug therapy: Secondary | ICD-10-CM | POA: Diagnosis not present

## 2018-09-17 DIAGNOSIS — I10 Essential (primary) hypertension: Secondary | ICD-10-CM | POA: Insufficient documentation

## 2018-09-17 DIAGNOSIS — J387 Other diseases of larynx: Secondary | ICD-10-CM | POA: Insufficient documentation

## 2018-09-17 HISTORY — PX: MICROLARYNGOSCOPY: SHX5208

## 2018-09-17 SURGERY — MICROLARYNGOSCOPY
Anesthesia: General | Site: Mouth | Laterality: Left

## 2018-09-17 MED ORDER — LIDOCAINE 2% (20 MG/ML) 5 ML SYRINGE
INTRAMUSCULAR | Status: AC
  Filled 2018-09-17: qty 5

## 2018-09-17 MED ORDER — LIDOCAINE 2% (20 MG/ML) 5 ML SYRINGE
INTRAMUSCULAR | Status: DC | PRN
  Administered 2018-09-17: 100 mg via INTRAVENOUS

## 2018-09-17 MED ORDER — MIDAZOLAM HCL 2 MG/2ML IJ SOLN
INTRAMUSCULAR | Status: AC
  Filled 2018-09-17: qty 2

## 2018-09-17 MED ORDER — FENTANYL CITRATE (PF) 250 MCG/5ML IJ SOLN
INTRAMUSCULAR | Status: DC | PRN
  Administered 2018-09-17: 50 ug via INTRAVENOUS

## 2018-09-17 MED ORDER — PROPOFOL 10 MG/ML IV BOLUS
INTRAVENOUS | Status: DC | PRN
  Administered 2018-09-17: 160 mg via INTRAVENOUS

## 2018-09-17 MED ORDER — DEXAMETHASONE SODIUM PHOSPHATE 10 MG/ML IJ SOLN
10.0000 mg | Freq: Once | INTRAMUSCULAR | Status: DC
  Filled 2018-09-17: qty 1

## 2018-09-17 MED ORDER — MIDAZOLAM HCL 5 MG/5ML IJ SOLN
INTRAMUSCULAR | Status: DC | PRN
  Administered 2018-09-17: 2 mg via INTRAVENOUS

## 2018-09-17 MED ORDER — ROCURONIUM BROMIDE 10 MG/ML (PF) SYRINGE
PREFILLED_SYRINGE | INTRAVENOUS | Status: AC
  Filled 2018-09-17: qty 10

## 2018-09-17 MED ORDER — ESMOLOL HCL 100 MG/10ML IV SOLN
INTRAVENOUS | Status: DC | PRN
  Administered 2018-09-17: 20 mg via INTRAVENOUS
  Administered 2018-09-17: 40 mg via INTRAVENOUS

## 2018-09-17 MED ORDER — ONDANSETRON HCL 4 MG/2ML IJ SOLN: 4.0000 mg | Freq: Once | INTRAMUSCULAR | Status: DC | PRN

## 2018-09-17 MED ORDER — PHENYLEPHRINE HCL-NACL 10-0.9 MG/250ML-% IV SOLN
INTRAVENOUS | Status: AC
  Filled 2018-09-17: qty 250

## 2018-09-17 MED ORDER — EPINEPHRINE HCL (NASAL) 0.1 % NA SOLN
NASAL | Status: AC
  Filled 2018-09-17: qty 30

## 2018-09-17 MED ORDER — CHLORHEXIDINE GLUCONATE CLOTH 2 % EX PADS: 6.0000 | MEDICATED_PAD | Freq: Once | CUTANEOUS | Status: DC

## 2018-09-17 MED ORDER — PROPOFOL 500 MG/50ML IV EMUL
INTRAVENOUS | Status: DC | PRN
  Administered 2018-09-17: 100 ug/kg/min via INTRAVENOUS

## 2018-09-17 MED ORDER — DEXAMETHASONE SODIUM PHOSPHATE 10 MG/ML IJ SOLN
INTRAMUSCULAR | Status: DC | PRN
  Administered 2018-09-17: 10 mg via INTRAVENOUS

## 2018-09-17 MED ORDER — FENTANYL CITRATE (PF) 100 MCG/2ML IJ SOLN: 25.0000 ug | INTRAMUSCULAR | Status: DC | PRN

## 2018-09-17 MED ORDER — FENTANYL CITRATE (PF) 250 MCG/5ML IJ SOLN
INTRAMUSCULAR | Status: AC
  Filled 2018-09-17: qty 5

## 2018-09-17 MED ORDER — LACTATED RINGERS IV SOLN
INTRAVENOUS | Status: DC | PRN
  Administered 2018-09-17: 07:00:00 via INTRAVENOUS

## 2018-09-17 MED ORDER — ONDANSETRON HCL 4 MG/2ML IJ SOLN
INTRAMUSCULAR | Status: AC
  Filled 2018-09-17: qty 2

## 2018-09-17 MED ORDER — OXYCODONE HCL 5 MG PO TABS: 5.0000 mg | ORAL_TABLET | Freq: Once | ORAL | Status: DC | PRN

## 2018-09-17 MED ORDER — EPINEPHRINE HCL (NASAL) 0.1 % NA SOLN
NASAL | Status: DC | PRN
  Administered 2018-09-17: 1 [drp] via NASAL

## 2018-09-17 MED ORDER — DEXAMETHASONE SODIUM PHOSPHATE 10 MG/ML IJ SOLN
INTRAMUSCULAR | Status: AC
  Filled 2018-09-17: qty 1

## 2018-09-17 MED ORDER — 0.9 % SODIUM CHLORIDE (POUR BTL) OPTIME
TOPICAL | Status: DC | PRN
  Administered 2018-09-17: 1000 mL

## 2018-09-17 MED ORDER — OXYCODONE HCL 5 MG/5ML PO SOLN: 5.0000 mg | Freq: Once | ORAL | Status: DC | PRN

## 2018-09-17 MED ORDER — SUGAMMADEX SODIUM 200 MG/2ML IV SOLN
INTRAVENOUS | Status: DC | PRN
  Administered 2018-09-17: 200 mg via INTRAVENOUS

## 2018-09-17 MED ORDER — CEFAZOLIN SODIUM-DEXTROSE 2-4 GM/100ML-% IV SOLN
2.0000 g | INTRAVENOUS | Status: AC
  Administered 2018-09-17: 2 g via INTRAVENOUS
  Filled 2018-09-17: qty 100

## 2018-09-17 MED ORDER — ROCURONIUM BROMIDE 10 MG/ML (PF) SYRINGE
PREFILLED_SYRINGE | INTRAVENOUS | Status: DC | PRN
  Administered 2018-09-17: 50 mg via INTRAVENOUS

## 2018-09-17 MED ORDER — PROPOFOL 10 MG/ML IV BOLUS
INTRAVENOUS | Status: AC
  Filled 2018-09-17: qty 20

## 2018-09-17 SURGICAL SUPPLY — 21 items
CANISTER SUCT 3000ML PPV (MISCELLANEOUS) ×3 IMPLANT
CONT SPEC 4OZ CLIKSEAL STRL BL (MISCELLANEOUS) ×3 IMPLANT
COVER BACK TABLE 60X90IN (DRAPES) ×3 IMPLANT
COVER MAYO STAND STRL (DRAPES) ×3 IMPLANT
COVER WAND RF STERILE (DRAPES) ×3 IMPLANT
DRAPE HALF SHEET 40X57 (DRAPES) ×3 IMPLANT
DRSG TELFA 3X8 NADH (GAUZE/BANDAGES/DRESSINGS) ×3 IMPLANT
GAUZE SPONGE 4X4 12PLY STRL (GAUZE/BANDAGES/DRESSINGS) IMPLANT
GLOVE BIOGEL M 7.0 STRL (GLOVE) ×3 IMPLANT
GUARD TEETH (MISCELLANEOUS) ×3 IMPLANT
H R LUBE JELLY XXX (MISCELLANEOUS) ×3 IMPLANT
KIT BASIN OR (CUSTOM PROCEDURE TRAY) IMPLANT
KIT TURNOVER KIT B (KITS) ×3 IMPLANT
NEEDLE 18GX1X1/2 (RX/OR ONLY) (NEEDLE) IMPLANT
NS IRRIG 1000ML POUR BTL (IV SOLUTION) ×3 IMPLANT
PAD ARMBOARD 7.5X6 YLW CONV (MISCELLANEOUS) ×3 IMPLANT
PATTIES SURGICAL .5 X1 (DISPOSABLE) ×3 IMPLANT
SOLUTION ANTI FOG 6CC (MISCELLANEOUS) ×3 IMPLANT
TOWEL GREEN STERILE FF (TOWEL DISPOSABLE) ×3 IMPLANT
TUBE CONNECTING 12'X1/4 (SUCTIONS) ×1
TUBE CONNECTING 12X1/4 (SUCTIONS) ×2 IMPLANT

## 2018-09-17 NOTE — Anesthesia Preprocedure Evaluation (Addendum)
Anesthesia Evaluation  Patient identified by MRN, date of birth, ID band Patient awake    Reviewed: Allergy & Precautions, NPO status , Patient's Chart, lab work & pertinent test results  Airway Mallampati: II  TM Distance: >3 FB Neck ROM: Full    Dental  (+) Teeth Intact, Dental Advisory Given   Pulmonary    breath sounds clear to auscultation       Cardiovascular hypertension,  Rhythm:Regular Rate:Normal     Neuro/Psych    GI/Hepatic   Endo/Other    Renal/GU      Musculoskeletal   Abdominal   Peds  Hematology   Anesthesia Other Findings   Reproductive/Obstetrics                             Anesthesia Physical Anesthesia Plan  ASA: III  Anesthesia Plan: General   Post-op Pain Management:    Induction: Intravenous  PONV Risk Score and Plan: Ondansetron, Dexamethasone and Propofol infusion  Airway Management Planned: Mask and Oral ETT  Additional Equipment:   Intra-op Plan:   Post-operative Plan: Extubation in OR  Informed Consent: I have reviewed the patients History and Physical, chart, labs and discussed the procedure including the risks, benefits and alternatives for the proposed anesthesia with the patient or authorized representative who has indicated his/her understanding and acceptance.     Dental advisory given  Plan Discussed with: CRNA and Anesthesiologist  Anesthesia Plan Comments:         Anesthesia Quick Evaluation

## 2018-09-17 NOTE — Anesthesia Postprocedure Evaluation (Signed)
Anesthesia Post Note  Patient: Jack Robinson  Procedure(s) Performed: MICROLARYNGOSCOPY WITH JET(smdl) VENTILATION, EXCISON OF LEFT ANTERIOR VOCAL CORD POLYPS (Left Mouth)     Patient location during evaluation: PACU Anesthesia Type: General Level of consciousness: awake and alert Pain management: pain level controlled Vital Signs Assessment: post-procedure vital signs reviewed and stable Respiratory status: spontaneous breathing, nonlabored ventilation, respiratory function stable and patient connected to nasal cannula oxygen Cardiovascular status: blood pressure returned to baseline and stable Postop Assessment: no apparent nausea or vomiting Anesthetic complications: no    Last Vitals:  Vitals:   09/17/18 0900 09/17/18 0910  BP: (!) 141/74 (!) 149/80  Pulse: 60 61  Resp: 14 16  Temp: (!) 36.1 C   SpO2: 99% 97%    Last Pain:  Vitals:   09/17/18 0830  TempSrc:   PainSc: 0-No pain                 Indria Bishara COKER

## 2018-09-17 NOTE — Op Note (Signed)
Operative Note: MICROLARYNGOSCOPY and BIOPSY  Patient: Jack Robinson  Medical record number: [id number]  Date:09/17/2018  Pre-operative Indications:  1.  Hoarseness     2.  History of laryngeal carcinoma treated with radiation therapy  Postoperative Indications: Same  Surgical Procedure:  Microlaryngoscopy    Excision left vocal cord mass  Anesthesia: Jet ventilation  Surgeon: Delsa Bern, M.D.  Assist: None  Complications: None  EBL: None   Brief History: The patient is a 68 y.o. male with a history of laryngeal carcinoma stage T1, treated with primary radiation therapy completed over 5 years ago.  Patient followed with history of intermittent hoarseness, previous laryngoscopy performed several years ago showed benign tissue.  Patient found to have a small left vocal cord mass on outpatient office laryngoscopy. Given the patient's history and findings I recommended microlaryngoscopy and excision of left vocal cord mass under general anesthesia, risks and benefits were discussed in detail with the patient and their family. They understand and agree with our plan for surgery which is scheduled at [facility] on an elective basis.  Surgical Procedure: The patient is brought to the operating room on 09/17/2018 and placed in supine position on the operating table. General endotracheal anesthesia was established without difficulty. When the patient was adequately anesthetized, surgical timeout was performed and correct identification of the patient and the surgical procedure. The patient was positioned and prepped and draped in sterile fashion.  Laryngoscopy was then undertaken with examination of the oral cavity, oropharynx, supraglottis, larynx and hypopharynx.  No other abnormalities were identified.  The laryngoscope was positioned for direct visualization of the patient's larynx.  Endotracheal tube was then removed and jet ventilation was undertaken for maintenance of  patient's general anesthesia.  Patient ventilated well with good gas exchange.  With the patient stable and prepared for surgery with adequate jet ventilation, the operating microscope was moved into position for microlaryngoscopy.  Excellent visualization of the entire larynx was achieved.  The patient was found to have 3 mm nodular exophytic mass along the superior aspect of the anterior left vocal cord.  Using cup forceps, microscissors and gentle dissection, left vocal cord mass was excised and sent to pathology for gross microscopic evaluation.  Reexamination showed no evidence of residual disease and minimal bleeding.  The patient's airway was stable, ventilating laryngoscope was removed without difficulty and the patient was managed with mask ventilation anesthesia for the remainder of his anesthesia recovery.  There was no evidence of airway trauma, no bleeding, no intraoral injury or tooth trauma.  Patient was awakened from anesthetic and transferred from the operating room to the recovery room in stable condition. There were no complications and blood loss was minimal.   Delsa Bern, M.D. [facility] 09/17/2018

## 2018-09-17 NOTE — Anesthesia Procedure Notes (Signed)
Procedure Name: Intubation Performed by: Milford Cage, CRNA Pre-anesthesia Checklist: Patient identified, Emergency Drugs available, Suction available and Patient being monitored Patient Re-evaluated:Patient Re-evaluated prior to induction Oxygen Delivery Method: Circle System Utilized Preoxygenation: Pre-oxygenation with 100% oxygen Induction Type: IV induction Ventilation: Mask ventilation without difficulty Laryngoscope Size: Mac and 3 Grade View: Grade II Tube type: Oral Tube size: 6.5 mm Number of attempts: 1 Airway Equipment and Method: Stylet and Oral airway Placement Confirmation: ETT inserted through vocal cords under direct vision,  positive ETCO2 and breath sounds checked- equal and bilateral Secured at: 22 cm Tube secured with: Tape Dental Injury: Teeth and Oropharynx as per pre-operative assessment

## 2018-09-17 NOTE — Transfer of Care (Signed)
Immediate Anesthesia Transfer of Care Note  Patient: Jack Robinson  Procedure(s) Performed: MICROLARYNGOSCOPY WITH JET(smdl) VENTILATION, EXCISON OF LEFT ANTERIOR VOCAL CORD POLYPS (Left Mouth)  Patient Location: PACU  Anesthesia Type:General  Level of Consciousness: awake  Airway & Oxygen Therapy: Patient Spontanous Breathing  Post-op Assessment: Report given to RN and Post -op Vital signs reviewed and stable  Post vital signs: Reviewed and stable  Last Vitals:  Vitals Value Taken Time  BP 125/74 09/17/18 0830  Temp 36.1 C 09/17/18 0830  Pulse 67 09/17/18 0831  Resp 15 09/17/18 0831  SpO2 100 % 09/17/18 0831  Vitals shown include unvalidated device data.  Last Pain:  Vitals:   09/17/18 0830  TempSrc:   PainSc: 0-No pain         Complications: No apparent anesthesia complications

## 2018-09-17 NOTE — H&P (Signed)
Jack Robinson is an 68 y.o. male.   Chief Complaint: Hoarseness HPI: hx of VC Ca tx'ed with XRT. Pt with vocal cord mass and hoarseness  Past Medical History:  Diagnosis Date  . 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 Chronic hoarseness    4 to 5 months  . GERD (gastroesophageal reflux disease)    pt. denies this is a problem  . History of radiation therapy 02/16/12-03/26/12   vocal cord /glottis ca  . Hx of echocardiogram    a. Echo (11/15):  mild LVH, EF 60-65%, Gr 2 DD, trivial AI, mild BAE  . Hypertension   . Pre-diabetes    per patient    Past Surgical History:  Procedure Laterality Date  . COLONOSCOPY W/ POLYPECTOMY  01/2010   every 5 years  . COLONOSCOPY WITH PROPOFOL N/A 05/21/2015   Procedure: COLONOSCOPY WITH PROPOFOL;  Surgeon: Garlan Fair, MD;  Location: [facility];  Service: Endoscopy;  Laterality: N/A;  . MICROLARYNGOSCOPY  01/19/2012   Procedure: MICROLARYNGOSCOPY;  Surgeon: Jerrell Belfast, MD;  Location: [facility];  Service: ENT;  Laterality: Left;  MICROLARYNGOSCOPY WITH EXCISON OF VOCAL CORD POLYP  . MICROLARYNGOSCOPY Left 10/16/2014   Procedure: MICROLARYNGOSCOPY WITH BIOPSY OF VOCAL MASS; LEFT;  Surgeon: Jerrell Belfast, MD;  Location: [facility];  Service: ENT;  Laterality: Left;  Marland Kitchen MICROLARYNGOSCOPY N/A 09/15/2016   Procedure: MICROLARYNGOSCOPY WITH EXCISION OF VOCAL CORD POLYP;  Surgeon: Jerrell Belfast, MD;  Location: [facility];  Service: ENT;  Laterality: N/A;  . TRANSURETHRAL RESECTION OF PROSTATE N/A 02/08/2015   Procedure: TRANSURETHRAL RESECTION OF THE PROSTATE (TURP);  Surgeon: Irine Seal, MD;  Location: [facility];  Service: Urology;  Laterality: N/A;  . vocal cord biopsy  01/19/12   right polyp bx=/ invasive squamous cell ca    History reviewed. No pertinent family history. Social History:  reports that he has never smoked. He has never used smokeless tobacco. He reports that he does not drink alcohol or use  drugs.  Allergies: No Known Allergies  Medications Prior to Admission  Medication Sig Dispense Refill  . amLODipine (NORVASC) 10 MG tablet Take 10 mg by mouth daily.    . cetirizine (ZYRTEC) 10 MG tablet Take 10 mg by mouth at bedtime.     . Cholecalciferol (VITAMIN D3) 1000 units CAPS Take 1,000 Units by mouth daily.    Marland Kitchen ezetimibe-simvastatin (VYTORIN) 10-20 MG per tablet Take 1 tablet by mouth daily.    Marland Kitchen lisinopril-hydrochlorothiazide (PRINZIDE,ZESTORETIC) 20-25 MG per tablet Take 1 tablet by mouth daily.     . metoprolol succinate (TOPROL-XL) 25 MG 24 hr tablet Take 25 mg by mouth at bedtime.    . potassium chloride SA (K-DUR,KLOR-CON) 20 MEQ tablet Take 20 mEq by mouth every evening.     Marland Kitchen aspirin EC 81 MG tablet Take 81 mg by mouth daily.      No results found for this or any previous visit (from the past 48 hour(s)). No results found.  Review of Systems  Constitutional: Negative.   HENT:       Hoarseness  Respiratory: Negative.   Cardiovascular: Negative.     Blood pressure (!) 155/79, pulse 68, temperature 97.8 F (36.6 C), temperature source Oral, resp. rate 18, height 5\' 11"  (1.803 m), weight 84.9 kg, SpO2 100 %. Physical Exam  Constitutional: He appears well-developed and well-nourished.  HENT:  Vocal cord mass  Neck: Normal range of motion. Neck supple.  Cardiovascular: Normal  rate.  Respiratory: Effort normal.     Assessment/Plan Adm for OP Microlaryngoscopy and exc of VC mass  Jerrell Belfast, MD 09/17/2018, 7:29 AM

## 2018-09-18 ENCOUNTER — Encounter (HOSPITAL_COMMUNITY): Payer: Self-pay | Admitting: Otolaryngology

## 2020-02-21 ENCOUNTER — Other Ambulatory Visit: Payer: Self-pay | Admitting: Otolaryngology

## 2020-03-13 ENCOUNTER — Other Ambulatory Visit (HOSPITAL_COMMUNITY)
Admission: RE | Admit: 2020-03-13 | Discharge: 2020-03-13 | Disposition: A | Discharge status: Home / Self Care | Payer: 59 | Source: Ambulatory Visit | Attending: Otolaryngology | Admitting: Otolaryngology

## 2020-03-13 DIAGNOSIS — Z20822 Contact with and (suspected) exposure to covid-19: Secondary | ICD-10-CM | POA: Insufficient documentation

## 2020-03-13 DIAGNOSIS — Z01812 Encounter for preprocedural laboratory examination: Secondary | ICD-10-CM | POA: Diagnosis not present

## 2020-03-13 LAB — SARS CORONAVIRUS 2 (TAT 6-24 HRS): SARS Coronavirus 2: NEGATIVE

## 2020-03-15 ENCOUNTER — Encounter (HOSPITAL_COMMUNITY): Payer: Self-pay | Admitting: Otolaryngology

## 2020-03-15 ENCOUNTER — Other Ambulatory Visit: Payer: Self-pay

## 2020-03-15 NOTE — Progress Notes (Signed)
Mr. Difatta denies chest pain or shortness of breath. Patient  tested negative  for Covid on 03/13/20 and has been in quarantine since that time.

## 2020-03-16 ENCOUNTER — Ambulatory Visit (HOSPITAL_COMMUNITY): Payer: 59 | Admitting: Certified Registered Nurse Anesthetist

## 2020-03-16 ENCOUNTER — Ambulatory Visit (HOSPITAL_COMMUNITY)
Admission: RE | Admit: 2020-03-16 | Discharge: 2020-03-16 | Disposition: A | Discharge status: Home / Self Care | Payer: 59 | Attending: Otolaryngology | Admitting: Otolaryngology

## 2020-03-16 ENCOUNTER — Encounter (HOSPITAL_COMMUNITY): Payer: Self-pay | Admitting: Otolaryngology

## 2020-03-16 ENCOUNTER — Encounter (HOSPITAL_COMMUNITY)
Admission: RE | Disposition: A | Discharge status: Home / Self Care | Payer: Self-pay | Source: Home / Self Care | Attending: Otolaryngology

## 2020-03-16 DIAGNOSIS — Z7982 Long term (current) use of aspirin: Secondary | ICD-10-CM | POA: Insufficient documentation

## 2020-03-16 DIAGNOSIS — R490 Dysphonia: Secondary | ICD-10-CM | POA: Insufficient documentation

## 2020-03-16 DIAGNOSIS — K219 Gastro-esophageal reflux disease without esophagitis: Secondary | ICD-10-CM | POA: Insufficient documentation

## 2020-03-16 DIAGNOSIS — Z8521 Personal history of malignant neoplasm of larynx: Secondary | ICD-10-CM | POA: Diagnosis not present

## 2020-03-16 DIAGNOSIS — J381 Polyp of vocal cord and larynx: Secondary | ICD-10-CM | POA: Insufficient documentation

## 2020-03-16 DIAGNOSIS — Z923 Personal history of irradiation: Secondary | ICD-10-CM | POA: Diagnosis not present

## 2020-03-16 DIAGNOSIS — R7303 Prediabetes: Secondary | ICD-10-CM | POA: Insufficient documentation

## 2020-03-16 DIAGNOSIS — I10 Essential (primary) hypertension: Secondary | ICD-10-CM | POA: Insufficient documentation

## 2020-03-16 DIAGNOSIS — Z79899 Other long term (current) drug therapy: Secondary | ICD-10-CM | POA: Insufficient documentation

## 2020-03-16 HISTORY — PX: MICROLARYNGOSCOPY: SHX5208

## 2020-03-16 LAB — BASIC METABOLIC PANEL
Anion gap: 12 (ref 5–15)
BUN: 12 mg/dL (ref 8–23)
CO2: 28 mmol/L (ref 22–32)
Calcium: 9.5 mg/dL (ref 8.9–10.3)
Chloride: 102 mmol/L (ref 98–111)
Creatinine, Ser: 0.92 mg/dL (ref 0.61–1.24)
GFR, Estimated: >60 mL/min (ref >60)
Glucose, Bld: 120 mg/dL — ABNORMAL HIGH (ref 70–99)
Potassium: 3.2 mmol/L — ABNORMAL LOW (ref 3.5–5.1)
Sodium: 142 mmol/L (ref 135–145)

## 2020-03-16 LAB — CBC
HCT: 39.7 % (ref 39.0–52.0)
Hemoglobin: 13.3 g/dL (ref 13.0–17.0)
MCH: 28.9 pg (ref 26.0–34.0)
MCHC: 33.5 g/dL (ref 30.0–36.0)
MCV: 86.1 fL (ref 80.0–100.0)
Platelets: 252 10*3/uL (ref 150–400)
RBC: 4.61 MIL/uL (ref 4.22–5.81)
RDW: 12.5 % (ref 11.5–15.5)
WBC: 5.1 10*3/uL (ref 4.0–10.5)
nRBC: 0 % (ref 0.0–0.2)

## 2020-03-16 LAB — GLUCOSE, CAPILLARY: Glucose-Capillary: 114 mg/dL — ABNORMAL HIGH (ref 70–99)

## 2020-03-16 SURGERY — MICROLARYNGOSCOPY
Anesthesia: General | Site: Mouth | Laterality: Left

## 2020-03-16 MED ORDER — ORAL CARE MOUTH RINSE: 15.0000 mL | Freq: Once | OROMUCOSAL | Status: DC

## 2020-03-16 MED ORDER — DEXAMETHASONE SODIUM PHOSPHATE 10 MG/ML IJ SOLN
INTRAMUSCULAR | Status: AC
  Filled 2020-03-16: qty 1

## 2020-03-16 MED ORDER — FENTANYL CITRATE (PF) 100 MCG/2ML IJ SOLN: 25.0000 ug | INTRAMUSCULAR | Status: DC | PRN

## 2020-03-16 MED ORDER — PROPOFOL 500 MG/50ML IV EMUL
INTRAVENOUS | Status: DC | PRN
  Administered 2020-03-16: 125 ug/kg/min via INTRAVENOUS

## 2020-03-16 MED ORDER — FENTANYL CITRATE (PF) 250 MCG/5ML IJ SOLN
INTRAMUSCULAR | Status: DC | PRN
  Administered 2020-03-16: 100 ug via INTRAVENOUS

## 2020-03-16 MED ORDER — LIDOCAINE 2% (20 MG/ML) 5 ML SYRINGE
INTRAMUSCULAR | Status: AC
  Filled 2020-03-16: qty 5

## 2020-03-16 MED ORDER — SUGAMMADEX SODIUM 200 MG/2ML IV SOLN
INTRAVENOUS | Status: DC | PRN
  Administered 2020-03-16: 200 mg via INTRAVENOUS

## 2020-03-16 MED ORDER — ROCURONIUM BROMIDE 10 MG/ML (PF) SYRINGE
PREFILLED_SYRINGE | INTRAVENOUS | Status: DC | PRN
  Administered 2020-03-16: 30 mg via INTRAVENOUS

## 2020-03-16 MED ORDER — LIDOCAINE 2% (20 MG/ML) 5 ML SYRINGE
INTRAMUSCULAR | Status: DC | PRN
  Administered 2020-03-16: 60 mg via INTRAVENOUS

## 2020-03-16 MED ORDER — AMISULPRIDE (ANTIEMETIC) 5 MG/2ML IV SOLN: 10.0000 mg | Freq: Once | INTRAVENOUS | Status: DC | PRN

## 2020-03-16 MED ORDER — ONDANSETRON HCL 4 MG/2ML IJ SOLN
INTRAMUSCULAR | Status: DC | PRN
  Administered 2020-03-16: 4 mg via INTRAVENOUS

## 2020-03-16 MED ORDER — EPINEPHRINE HCL (NASAL) 0.1 % NA SOLN
NASAL | Status: AC
  Filled 2020-03-16: qty 30

## 2020-03-16 MED ORDER — PHENYLEPHRINE 40 MCG/ML (10ML) SYRINGE FOR IV PUSH (FOR BLOOD PRESSURE SUPPORT)
PREFILLED_SYRINGE | INTRAVENOUS | Status: AC
  Filled 2020-03-16: qty 20

## 2020-03-16 MED ORDER — ROCURONIUM BROMIDE 10 MG/ML (PF) SYRINGE
PREFILLED_SYRINGE | INTRAVENOUS | Status: AC
  Filled 2020-03-16: qty 10

## 2020-03-16 MED ORDER — PROPOFOL 10 MG/ML IV BOLUS
INTRAVENOUS | Status: DC | PRN
Start: 2020-03-16 — End: 2020-03-16
  Administered 2020-03-16: 200 mg via INTRAVENOUS

## 2020-03-16 MED ORDER — CEFAZOLIN SODIUM-DEXTROSE 2-4 GM/100ML-% IV SOLN
2.0000 g | INTRAVENOUS | Status: AC
  Administered 2020-03-16: 2 g via INTRAVENOUS
  Filled 2020-03-16: qty 100

## 2020-03-16 MED ORDER — FENTANYL CITRATE (PF) 250 MCG/5ML IJ SOLN
INTRAMUSCULAR | Status: AC
  Filled 2020-03-16: qty 5

## 2020-03-16 MED ORDER — CHLORHEXIDINE GLUCONATE 0.12 % MT SOLN
15.0000 mL | Freq: Once | OROMUCOSAL | Status: DC
  Filled 2020-03-16: qty 15

## 2020-03-16 MED ORDER — ONDANSETRON HCL 4 MG/2ML IJ SOLN
INTRAMUSCULAR | Status: AC
  Filled 2020-03-16: qty 2

## 2020-03-16 MED ORDER — EPINEPHRINE HCL (NASAL) 0.1 % NA SOLN
NASAL | Status: DC | PRN
  Administered 2020-03-16: 1 [drp] via NASAL

## 2020-03-16 MED ORDER — LACTATED RINGERS IV SOLN: INTRAVENOUS | Status: DC

## 2020-03-16 MED ORDER — ACETAMINOPHEN 500 MG PO TABS
1000.0000 mg | ORAL_TABLET | Freq: Once | ORAL | Status: AC
  Administered 2020-03-16: 1000 mg via ORAL
  Filled 2020-03-16: qty 2

## 2020-03-16 MED ORDER — PROPOFOL 10 MG/ML IV BOLUS
INTRAVENOUS | Status: AC
  Filled 2020-03-16: qty 20

## 2020-03-16 SURGICAL SUPPLY — 23 items
CANISTER SUCT 3000ML PPV (MISCELLANEOUS) ×3 IMPLANT
CNTNR URN SCR LID CUP LEK RST (MISCELLANEOUS) IMPLANT
CONT SPEC 4OZ STRL OR WHT (MISCELLANEOUS)
COVER BACK TABLE 60X90IN (DRAPES) ×3 IMPLANT
COVER MAYO STAND STRL (DRAPES) ×3 IMPLANT
COVER WAND RF STERILE (DRAPES) ×3 IMPLANT
DRAPE HALF SHEET 40X57 (DRAPES) ×3 IMPLANT
DRSG TELFA 3X8 NADH (GAUZE/BANDAGES/DRESSINGS) IMPLANT
GAUZE SPONGE 4X4 12PLY STRL (GAUZE/BANDAGES/DRESSINGS) IMPLANT
GLOVE BIOGEL M 7.0 STRL (GLOVE) ×3 IMPLANT
GUARD TEETH (MISCELLANEOUS) IMPLANT
KIT BASIN OR (CUSTOM PROCEDURE TRAY) IMPLANT
KIT TURNOVER KIT B (KITS) ×3 IMPLANT
NEEDLE 18GX1X1/2 (RX/OR ONLY) (NEEDLE) IMPLANT
NS IRRIG 1000ML POUR BTL (IV SOLUTION) ×3 IMPLANT
PAD ARMBOARD 7.5X6 YLW CONV (MISCELLANEOUS) ×3 IMPLANT
PATTIES SURGICAL .5 X1 (DISPOSABLE) ×3 IMPLANT
SOL ANTI FOG 6CC (MISCELLANEOUS) ×1 IMPLANT
SOLUTION ANTI FOG 6CC (MISCELLANEOUS) ×2
SURGILUBE 2OZ TUBE FLIPTOP (MISCELLANEOUS) IMPLANT
TOWEL GREEN STERILE FF (TOWEL DISPOSABLE) ×3 IMPLANT
TUBE CONNECTING 12'X1/4 (SUCTIONS) ×1
TUBE CONNECTING 12X1/4 (SUCTIONS) ×2 IMPLANT

## 2020-03-16 NOTE — Anesthesia Preprocedure Evaluation (Signed)
Anesthesia Evaluation  Patient identified by MRN, date of birth, ID band Patient awake    Reviewed: Allergy & Precautions, NPO status , Patient's Chart, lab work & pertinent test results  Airway Mallampati: II  TM Distance: >3 FB Neck ROM: Full    Dental  (+) Dental Advisory Given   Pulmonary neg pulmonary ROS,    breath sounds clear to auscultation       Cardiovascular hypertension, Pt. on medications and Pt. on home beta blockers  Rhythm:Regular Rate:Normal     Neuro/Psych negative neurological ROS     GI/Hepatic Neg liver ROS, GERD  ,  Endo/Other  negative endocrine ROS  Renal/GU negative Renal ROS     Musculoskeletal   Abdominal   Peds  Hematology negative hematology ROS (+)   Anesthesia Other Findings   Reproductive/Obstetrics                             Anesthesia Physical Anesthesia Plan  ASA: II  Anesthesia Plan: General   Post-op Pain Management:    Induction: Intravenous  PONV Risk Score and Plan: 2 and Dexamethasone, Ondansetron and TIVA  Airway Management Planned: Oral ETT and Natural Airway  Additional Equipment: None  Intra-op Plan:   Post-operative Plan: Extubation in OR  Informed Consent: I have reviewed the patients History and Physical, chart, labs and discussed the procedure including the risks, benefits and alternatives for the proposed anesthesia with the patient or authorized representative who has indicated his/her understanding and acceptance.     Dental advisory given  Plan Discussed with: CRNA  Anesthesia Plan Comments:         Anesthesia Quick Evaluation

## 2020-03-16 NOTE — Anesthesia Postprocedure Evaluation (Signed)
Anesthesia Post Note  Patient: CARMERON HEADY  Procedure(s) Performed: MICROLARYNGOSCOPY WITH EXCISION OF LEFT VOCAL CORD NODULE WITH JET VENTILATION (Left Mouth)     Patient location during evaluation: PACU Anesthesia Type: General Level of consciousness: awake and alert Pain management: pain level controlled Vital Signs Assessment: post-procedure vital signs reviewed and stable Respiratory status: spontaneous breathing, nonlabored ventilation, respiratory function stable and patient connected to nasal cannula oxygen Cardiovascular status: blood pressure returned to baseline and stable Postop Assessment: no apparent nausea or vomiting Anesthetic complications: no   No complications documented.  Last Vitals:  Vitals:   03/16/20 1118 03/16/20 1133  BP: 129/72 (!) 150/81  Pulse: (!) 59 (!) 57  Resp: 13 17  Temp: 37 C (!) 36.3 C  SpO2: 100% 99%    Last Pain:  Vitals:   03/16/20 1118  TempSrc:   PainSc: 2                  Tiajuana Amass

## 2020-03-16 NOTE — Anesthesia Procedure Notes (Addendum)
Procedure Name: General with mask airway Date/Time: 03/16/2020 10:45 AM Performed by: Dorthea Cove, CRNA Pre-anesthesia Checklist: Patient identified, Emergency Drugs available, Suction available and Patient being monitored Patient Re-evaluated:Patient Re-evaluated prior to induction Oxygen Delivery Method: Circle system utilized Preoxygenation: Pre-oxygenation with 100% oxygen Induction Type: IV induction Ventilation: Mask ventilation without difficulty and Oral airway inserted - appropriate to patient size Number of attempts: 1 Airway Equipment and Method: Stylet and Oral airway Tube secured with: Tape Dental Injury: Teeth and Oropharynx as per pre-operative assessment  Comments: Direct Laryngoscopy by Dr. Wilburn Cornelia. Jet Ventilation preformed throughout the procedure. After Dr. Wilburn Cornelia completed the procedure the patient was mask ventilated until airway reflexes regained.

## 2020-03-16 NOTE — H&P (Signed)
Jack Robinson is an 69 y.o. male.   Chief Complaint: Chronic hoarseness HPI: Patient with a history of laryngeal carcinoma, he underwent radiation therapy for primary treatment more than 8 years ago.  The patient has continued to have intermittent hoarseness and recent examination showed an area of irritation in the left anterior vocal cord.  Is undergone previous microlaryngoscopy for benign biopsies.  Past Medical History:  Diagnosis Date  . 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 Chronic hoarseness    4 to 5 months  . GERD (gastroesophageal reflux disease)    pt. denies this is a problem  . History of radiation therapy 02/16/12-03/26/12   vocal cord /glottis ca  . Hx of echocardiogram    a. Echo (11/15):  mild LVH, EF 60-65%, Gr 2 DD, trivial AI, mild BAE  . Hypertension   . Pre-diabetes    per patient    Past Surgical History:  Procedure Laterality Date  . COLONOSCOPY W/ POLYPECTOMY  01/2010   every 5 years  . COLONOSCOPY WITH PROPOFOL N/A 05/21/2015   Procedure: COLONOSCOPY WITH PROPOFOL;  Surgeon: Garlan Fair, MD;  Location: [facility];  Service: Endoscopy;  Laterality: N/A;  . MICROLARYNGOSCOPY  01/19/2012   Procedure: MICROLARYNGOSCOPY;  Surgeon: Jerrell Belfast, MD;  Location: [facility];  Service: ENT;  Laterality: Left;  MICROLARYNGOSCOPY WITH EXCISON OF VOCAL CORD POLYP  . MICROLARYNGOSCOPY Left 10/16/2014   Procedure: MICROLARYNGOSCOPY WITH BIOPSY OF VOCAL MASS; LEFT;  Surgeon: Jerrell Belfast, MD;  Location: [facility];  Service: ENT;  Laterality: Left;  Marland Kitchen MICROLARYNGOSCOPY N/A 09/15/2016   Procedure: MICROLARYNGOSCOPY WITH EXCISION OF VOCAL CORD POLYP;  Surgeon: Jerrell Belfast, MD;  Location: [facility];  Service: ENT;  Laterality: N/A;  . MICROLARYNGOSCOPY Left 09/17/2018   Procedure: MICROLARYNGOSCOPY WITH JET(smdl) VENTILATION, EXCISON OF LEFT ANTERIOR VOCAL CORD POLYPS;  Surgeon: Jerrell Belfast, MD;  Location: [facility];  Service: ENT;   Laterality: Left;  . TRANSURETHRAL RESECTION OF PROSTATE N/A 02/08/2015   Procedure: TRANSURETHRAL RESECTION OF THE PROSTATE (TURP);  Surgeon: Irine Seal, MD;  Location: [facility];  Service: Urology;  Laterality: N/A;  . vocal cord biopsy  01/19/12   right polyp bx=/ invasive squamous cell ca    History reviewed. No pertinent family history. Social History:  reports that he has never smoked. He has never used smokeless tobacco. He reports that he does not drink alcohol and does not use drugs.  Allergies: No Known Allergies  Medications Prior to Admission  Medication Sig Dispense Refill  . amLODipine (NORVASC) 10 MG tablet Take 10 mg by mouth daily.    Marland Kitchen aspirin EC 81 MG tablet Take 81 mg by mouth daily.    . cetirizine (ZYRTEC) 10 MG tablet Take 10 mg by mouth daily as needed for allergies.    . Cholecalciferol (VITAMIN D3) 1000 units CAPS Take 1,000 Units by mouth daily.    Marland Kitchen ezetimibe-simvastatin (VYTORIN) 10-20 MG per tablet Take 1 tablet by mouth daily.    . fluticasone (FLONASE) 50 MCG/ACT nasal spray Place 1 spray into both nostrils daily as needed for allergies.    Marland Kitchen lisinopril-hydrochlorothiazide (PRINZIDE,ZESTORETIC) 20-25 MG per tablet Take 1 tablet by mouth daily.     . metoprolol succinate (TOPROL-XL) 25 MG 24 hr tablet Take 25 mg by mouth at bedtime.    . potassium chloride SA (K-DUR,KLOR-CON) 20 MEQ tablet Take 20 mEq by mouth every evening.       Results for orders  placed or performed during the hospital encounter of 03/16/20 (from the past 48 hour(s))  CBC per protocol     Status: None   Collection Time: 03/16/20  8:32 AM  Result Value Ref Range   WBC 5.1 4.0 - 10.5 K/uL   RBC 4.61 4.22 - 5.81 MIL/uL   Hemoglobin 13.3 13.0 - 17.0 g/dL   HCT 39.7 39.0 - 52.0 %   MCV 86.1 80.0 - 100.0 fL   MCH 28.9 26.0 - 34.0 pg   MCHC 33.5 30.0 - 36.0 g/dL   RDW 12.5 11.5 - 15.5 %   Platelets 252 150 - 400 K/uL   nRBC 0.0 0.0 - 0.2 %    Comment: Performed at [facility], [place] [address], [place], [place] [address]  Basic metabolic panel per protocol     Status: Abnormal   Collection Time: 03/16/20  8:32 AM  Result Value Ref Range   Sodium 142 135 - 145 mmol/L   Potassium 3.2 (L) 3.5 - 5.1 mmol/L   Chloride 102 98 - 111 mmol/L   CO2 28 22 - 32 mmol/L   Glucose, Bld 120 (H) 70 - 99 mg/dL    Comment: Glucose reference range applies only to samples taken after fasting for at least 8 hours.   BUN 12 8 - 23 mg/dL   Creatinine, Ser 0.92 0.61 - 1.24 mg/dL   Calcium 9.5 8.9 - 10.3 mg/dL   GFR, Estimated >60 >60 mL/min    Comment: (NOTE) Calculated using the CKD-EPI Creatinine Equation (2021)    Anion gap 12 5 - 15    Comment: Performed at [facility] [address], [place], [place] [address]  Glucose, capillary     Status: Abnormal   Collection Time: 03/16/20  8:53 AM  Result Value Ref Range   Glucose-Capillary 114 (H) 70 - 99 mg/dL    Comment: Glucose reference range applies only to samples taken after fasting for at least 8 hours.   No results found.  Review of Systems  Constitutional: Negative.   HENT:       Chronic hoarseness  Respiratory: Negative.   Cardiovascular: Negative.     Blood pressure (!) 151/80, pulse 73, temperature 97.9 F (36.6 C), temperature source Oral, resp. rate 17, height 5\' 11"  (1.803 m), weight 84.8 kg, SpO2 100 %. Physical Exam Constitutional:      Appearance: He is normal weight.  HENT:     Mouth/Throat:     Comments: Left anterior vocal cord lesion Cardiovascular:     Pulses: Normal pulses.  Pulmonary:     Effort: Pulmonary effort is normal.  Musculoskeletal:     Cervical back: Normal range of motion.  Neurological:     Mental Status: He is alert.      Assessment/Plan Patient admitted for microlaryngoscopy and biopsy of left anterior vocal cord lesion under general anesthesia as an outpatient.  Jerrell Belfast, MD 03/16/2020, 10:19 AM

## 2020-03-16 NOTE — Op Note (Signed)
Operative Note: MICROLARYNGOSCOPY and BIOPSY  Patient: Jack Robinson  Medical record number: [id number]  Date:03/16/2020  Pre-operative Indications: 1.  Chronic hoarseness     2.  History of laryngeal carcinoma status post radiation therapy  Postoperative Indications: Same  Surgical Procedure:  Microlaryngoscopy    Excision left vocal cord mass  Anesthesia: General/jet  Surgeon: Delsa Bern, M.D.  Assist: None  Complications: None  EBL: None   Brief History: The patient is a 69 y.o. male with a history of squamous cell carcinoma of the larynx.  The patient underwent radiation therapy in 2013.  He is a non-smoker.  He has had chronic hoarseness has undergone previous biopsy, last performed in 2018 for benign findings. Given the patient's history and findings I recommended microlaryngoscopy and biopsy of left vocal cord mass under general anesthesia, risks and benefits were discussed in detail with the patient and their family. They understand and agree with our plan for surgery which is scheduled at [facility] on an elective basis.  Surgical Procedure: The patient is brought to the operating room on 03/16/2020 and placed in supine position on the operating table. General endotracheal anesthesia was established without difficulty. When the patient was adequately anesthetized, surgical timeout was performed and correct identification of the patient and the surgical procedure. The patient was positioned and prepped and draped in sterile fashion.  Laryngoscopy was then undertaken with examination of the oral cavity, oropharynx, supraglottis, larynx and hypopharynx.  No other abnormalities were identified.  The laryngoscope was positioned for direct visualization of the patient's larynx.  Endotracheal tube was then removed and jet ventilation was undertaken for maintenance of patient's general anesthesia.  Patient ventilated well with good gas exchange.  With the patient  stable and prepared for surgery with adequate jet ventilation, the operating microscope was moved into position for microlaryngoscopy.  Excellent visualization of the entire larynx was achieved.  The patient was found to have raised vascular area on the superior surface of the left anterior vocal cord.  Using cup forceps, microscissors and gentle dissection, benign-appearing mass was excised and sent to pathology for gross microscopic evaluation.  Reexamination showed no evidence of residual disease and minimal bleeding.  The patient's airway was stable, ventilating laryngoscope was removed without difficulty and the patient was managed with mask ventilation anesthesia for the remainder of his anesthesia recovery.  There was no evidence of airway trauma, no bleeding, no intraoral injury or tooth trauma.  Patient was awakened from anesthetic and transferred from the operating room to the recovery room in stable condition. There were no complications and blood loss was minimal.   Delsa Bern, M.D. [facility] 03/16/2020

## 2020-03-16 NOTE — Transfer of Care (Signed)
Immediate Anesthesia Transfer of Care Note  Patient: Jack Robinson  Procedure(s) Performed: MICROLARYNGOSCOPY WITH EXCISION OF LEFT VOCAL CORD NODULE WITH JET VENTILATION (Left Mouth)  Patient Location: PACU  Anesthesia Type:General  Level of Consciousness: awake, alert  and oriented  Airway & Oxygen Therapy: Patient Spontanous Breathing and Patient connected to face mask oxygen  Post-op Assessment: Report given to RN and Post -op Vital signs reviewed and stable  Post vital signs: Reviewed and stable  Last Vitals:  Vitals Value Taken Time  BP 129/72 03/16/20 1118  Temp    Pulse 68 03/16/20 1121  Resp 18 03/16/20 1121  SpO2 100 % 03/16/20 1121  Vitals shown include unvalidated device data.  Last Pain:  Vitals:   03/16/20 0903  TempSrc:   PainSc: 0-No pain         Complications: No complications documented.

## 2020-03-17 ENCOUNTER — Encounter (HOSPITAL_COMMUNITY): Payer: Self-pay | Admitting: Otolaryngology

## 2020-03-19 LAB — SURGICAL PATHOLOGY

## 2021-02-14 ENCOUNTER — Other Ambulatory Visit: Payer: Self-pay | Admitting: Urology

## 2021-02-14 DIAGNOSIS — C61 Malignant neoplasm of prostate: Secondary | ICD-10-CM

## 2021-03-15 ENCOUNTER — Ambulatory Visit
Admission: RE | Admit: 2021-03-15 | Discharge: 2021-03-15 | Disposition: A | Discharge status: Home / Self Care | Payer: Medicare Other | Source: Ambulatory Visit | Attending: Urology | Admitting: Urology

## 2021-03-15 ENCOUNTER — Other Ambulatory Visit: Payer: Self-pay

## 2021-03-15 DIAGNOSIS — C61 Malignant neoplasm of prostate: Secondary | ICD-10-CM

## 2021-03-15 MED ORDER — GADOBENATE DIMEGLUMINE 529 MG/ML IV SOLN
17.0000 mL | Freq: Once | INTRAVENOUS | Status: AC | PRN
  Administered 2021-03-15: 17 mL via INTRAVENOUS
